# Patient Record
Sex: Female | Born: 1985 | Race: White | Hispanic: No | Marital: Married | Smoking: Never smoker
Health system: Southern US, Community
[De-identification: ages and names within clinical notes are randomized; demographics above are authoritative.]

## PROBLEM LIST (undated history)

## (undated) DIAGNOSIS — Z789 Other specified health status: Secondary | ICD-10-CM

## (undated) DIAGNOSIS — F419 Anxiety disorder, unspecified: Secondary | ICD-10-CM

## (undated) DIAGNOSIS — O09299 Supervision of pregnancy with other poor reproductive or obstetric history, unspecified trimester: Secondary | ICD-10-CM

## (undated) DIAGNOSIS — Z8759 Personal history of other complications of pregnancy, childbirth and the puerperium: Secondary | ICD-10-CM

## (undated) HISTORY — DX: Personal history of other complications of pregnancy, childbirth and the puerperium: Z87.59

## (undated) HISTORY — DX: Morbid (severe) obesity due to excess calories: E66.01

## (undated) HISTORY — DX: Supervision of pregnancy with other poor reproductive or obstetric history, unspecified trimester: O09.299

## (undated) HISTORY — PX: CYSTECTOMY: SUR359

## (undated) HISTORY — PX: WISDOM TOOTH EXTRACTION: SHX21

---

## 2019-01-14 LAB — OB RESULTS CONSOLE RUBELLA ANTIBODY, IGM: Rubella: NON-IMMUNE/NOT IMMUNE

## 2019-01-14 LAB — OB RESULTS CONSOLE HEPATITIS B SURFACE ANTIGEN: Hepatitis B Surface Ag: NEGATIVE

## 2019-01-14 LAB — OB RESULTS CONSOLE HIV ANTIBODY (ROUTINE TESTING): HIV: NONREACTIVE

## 2019-01-14 LAB — OB RESULTS CONSOLE GC/CHLAMYDIA
Chlamydia: NEGATIVE
Gonorrhea: NEGATIVE

## 2019-03-05 LAB — OB RESULTS CONSOLE RPR: RPR: NONREACTIVE

## 2019-04-30 LAB — OB RESULTS CONSOLE GBS: GBS: NEGATIVE

## 2019-05-03 NOTE — L&D Delivery Note (Signed)
Delivery Note Patient pushed for 2.5 hours after she was noted to be C/C/+1. At 3:47 PM a viable and healthy female was delivered via Vaginal, Spontaneous (Presentation: Left Occiput Anterior).  APGAR: 8, 9; weight  Pending. Shoulders and body easily delivered. Baby laid on maternal abdomen. Delayed cord clamping done. Cord cut by father.  Cord avulsion occurred and the Placenta had to be manually removed, intact, 3 vessels noted.   There was notable uterine atony and the IV was not working effectively.  A postpartum hemorrhage was called and the staff was in place.  IM methergine was given. Another large bore IV was placed in the left arm.  Vigorous uterine massage was done and then the IV pitocin was bolused in and the bleeding decreased.  The patient remained alert, awake and oriented and was hemodynamically stable. Stat CBC was drawn.  Second degree vaginal / perineal laceration was repaired with 2-0 vicryl and 2-0 chromic.  Hemostasis assured.  Patient tolerated delivery well.     Complications: Manual removal of placenta ; post partum hemorrhage.  Anesthesia: Epidural and local 1% lidocaine Episiotomy: None Lacerations: 2nd degree;Perineal Suture Repair: 2.0 vicryl and 3-0 chromic Est. Blood Loss (mL): 1594  Mom to postpartum.  Baby to Couplet care / Skin to Skin.  Gualberto Wahlen 05/08/2019, 4:50 PM

## 2019-05-07 ENCOUNTER — Inpatient Hospital Stay (HOSPITAL_COMMUNITY)
Admission: AD | Admit: 2019-05-07 | Discharge: 2019-05-10 | DRG: 806 | Disposition: A | Discharge status: Home / Self Care | Payer: 59 | Attending: Obstetrics & Gynecology | Admitting: Obstetrics & Gynecology

## 2019-05-07 ENCOUNTER — Encounter (HOSPITAL_COMMUNITY): Payer: Self-pay | Admitting: Obstetrics & Gynecology

## 2019-05-07 ENCOUNTER — Inpatient Hospital Stay (HOSPITAL_COMMUNITY): Payer: 59 | Admitting: Anesthesiology

## 2019-05-07 ENCOUNTER — Other Ambulatory Visit: Payer: Self-pay

## 2019-05-07 DIAGNOSIS — J45909 Unspecified asthma, uncomplicated: Secondary | ICD-10-CM | POA: Diagnosis present

## 2019-05-07 DIAGNOSIS — O3660X0 Maternal care for excessive fetal growth, unspecified trimester, not applicable or unspecified: Secondary | ICD-10-CM | POA: Diagnosis present

## 2019-05-07 DIAGNOSIS — O99214 Obesity complicating childbirth: Secondary | ICD-10-CM | POA: Diagnosis present

## 2019-05-07 DIAGNOSIS — O4290 Premature rupture of membranes, unspecified as to length of time between rupture and onset of labor, unspecified weeks of gestation: Secondary | ICD-10-CM | POA: Diagnosis present

## 2019-05-07 DIAGNOSIS — Z3A37 37 weeks gestation of pregnancy: Secondary | ICD-10-CM | POA: Diagnosis not present

## 2019-05-07 DIAGNOSIS — O403XX0 Polyhydramnios, third trimester, not applicable or unspecified: Secondary | ICD-10-CM | POA: Diagnosis present

## 2019-05-07 DIAGNOSIS — O139 Gestational [pregnancy-induced] hypertension without significant proteinuria, unspecified trimester: Secondary | ICD-10-CM | POA: Diagnosis not present

## 2019-05-07 DIAGNOSIS — O4292 Full-term premature rupture of membranes, unspecified as to length of time between rupture and onset of labor: Secondary | ICD-10-CM | POA: Diagnosis present

## 2019-05-07 DIAGNOSIS — Z2839 Other underimmunization status: Secondary | ICD-10-CM

## 2019-05-07 DIAGNOSIS — Z283 Underimmunization status: Secondary | ICD-10-CM

## 2019-05-07 DIAGNOSIS — O9952 Diseases of the respiratory system complicating childbirth: Secondary | ICD-10-CM | POA: Diagnosis present

## 2019-05-07 DIAGNOSIS — O3663X0 Maternal care for excessive fetal growth, third trimester, not applicable or unspecified: Secondary | ICD-10-CM | POA: Diagnosis present

## 2019-05-07 DIAGNOSIS — O42913 Preterm premature rupture of membranes, unspecified as to length of time between rupture and onset of labor, third trimester: Secondary | ICD-10-CM | POA: Diagnosis not present

## 2019-05-07 HISTORY — DX: Other specified health status: Z78.9

## 2019-05-07 LAB — COMPREHENSIVE METABOLIC PANEL
ALT: 16 U/L (ref 0–44)
AST: 18 U/L (ref 15–41)
Albumin: 2.5 g/dL — ABNORMAL LOW (ref 3.5–5.0)
Alkaline Phosphatase: 123 U/L (ref 38–126)
Anion gap: 12 (ref 5–15)
BUN: 12 mg/dL (ref 6–20)
CO2: 17 mmol/L — ABNORMAL LOW (ref 22–32)
Calcium: 9.8 mg/dL (ref 8.9–10.3)
Chloride: 106 mmol/L (ref 98–111)
Creatinine, Ser: 0.62 mg/dL (ref 0.44–1.00)
GFR calc Af Amer: >60 mL/min (ref >60)
GFR calc non Af Amer: >60 mL/min (ref >60)
Glucose, Bld: 93 mg/dL (ref 70–99)
Potassium: 3.8 mmol/L (ref 3.5–5.1)
Sodium: 135 mmol/L (ref 135–145)
Total Bilirubin: 0.6 mg/dL (ref 0.3–1.2)
Total Protein: 6 g/dL — ABNORMAL LOW (ref 6.5–8.1)

## 2019-05-07 LAB — ABO/RH: ABO/RH(D): O POS

## 2019-05-07 LAB — CBC
HCT: 37.7 % (ref 36.0–46.0)
Hemoglobin: 12.6 g/dL (ref 12.0–15.0)
MCH: 28.6 pg (ref 26.0–34.0)
MCHC: 33.4 g/dL (ref 30.0–36.0)
MCV: 85.5 fL (ref 80.0–100.0)
Platelets: 296 10*3/uL (ref 150–400)
RBC: 4.41 MIL/uL (ref 3.87–5.11)
RDW: 13.6 % (ref 11.5–15.5)
WBC: 9.1 10*3/uL (ref 4.0–10.5)
nRBC: 0 % (ref 0.0–0.2)

## 2019-05-07 LAB — PROTEIN / CREATININE RATIO, URINE
Creatinine, Urine: 28.02 mg/dL
Total Protein, Urine: <6 mg/dL

## 2019-05-07 LAB — LACTATE DEHYDROGENASE: LDH: 133 U/L (ref 98–192)

## 2019-05-07 LAB — POCT FERN TEST: POCT Fern Test: POSITIVE

## 2019-05-07 LAB — TYPE AND SCREEN
ABO/RH(D): O POS
Antibody Screen: NEGATIVE

## 2019-05-07 LAB — URIC ACID: Uric Acid, Serum: 6.3 mg/dL (ref 2.5–7.1)

## 2019-05-07 MED ORDER — EPHEDRINE 5 MG/ML INJ: 10.0000 mg | INTRAVENOUS | Status: DC | PRN

## 2019-05-07 MED ORDER — TERBUTALINE SULFATE 1 MG/ML IJ SOLN: 0.2500 mg | Freq: Once | INTRAMUSCULAR | Status: DC | PRN

## 2019-05-07 MED ORDER — DIPHENHYDRAMINE HCL 50 MG/ML IJ SOLN: 12.5000 mg | INTRAMUSCULAR | Status: DC | PRN

## 2019-05-07 MED ORDER — OXYTOCIN 40 UNITS IN NORMAL SALINE INFUSION - SIMPLE MED
1.0000 m[IU]/min | INTRAVENOUS | Status: DC
  Administered 2019-05-07: 14:00:00 1 m[IU]/min via INTRAVENOUS
  Filled 2019-05-07: qty 1000

## 2019-05-07 MED ORDER — SODIUM CHLORIDE (PF) 0.9 % IJ SOLN
INTRAMUSCULAR | Status: DC | PRN
  Administered 2019-05-07: 12 mL/h via EPIDURAL

## 2019-05-07 MED ORDER — FENTANYL-BUPIVACAINE-NACL 0.5-0.125-0.9 MG/250ML-% EP SOLN
12.0000 mL/h | EPIDURAL | Status: DC | PRN
  Administered 2019-05-08: 12 mL/h via EPIDURAL
  Filled 2019-05-07 (×2): qty 250

## 2019-05-07 MED ORDER — MISOPROSTOL 50MCG HALF TABLET
50.0000 ug | ORAL_TABLET | Freq: Once | ORAL | Status: AC
  Administered 2019-05-07: 05:00:00 50 ug via ORAL
  Filled 2019-05-07: qty 1

## 2019-05-07 MED ORDER — PHENYLEPHRINE 40 MCG/ML (10ML) SYRINGE FOR IV PUSH (FOR BLOOD PRESSURE SUPPORT): 80.0000 ug | PREFILLED_SYRINGE | INTRAVENOUS | Status: DC | PRN

## 2019-05-07 MED ORDER — OXYTOCIN 40 UNITS IN NORMAL SALINE INFUSION - SIMPLE MED
2.5000 [IU]/h | INTRAVENOUS | Status: DC
  Filled 2019-05-07: qty 1000

## 2019-05-07 MED ORDER — LACTATED RINGERS IV SOLN: 500.0000 mL | INTRAVENOUS | Status: DC | PRN

## 2019-05-07 MED ORDER — OXYTOCIN BOLUS FROM INFUSION
500.0000 mL | Freq: Once | INTRAVENOUS | Status: AC
  Administered 2019-05-08: 16:00:00 500 mL via INTRAVENOUS

## 2019-05-07 MED ORDER — OXYCODONE-ACETAMINOPHEN 5-325 MG PO TABS: 2.0000 | ORAL_TABLET | ORAL | Status: DC | PRN

## 2019-05-07 MED ORDER — LIDOCAINE HCL (PF) 1 % IJ SOLN
30.0000 mL | INTRAMUSCULAR | Status: AC | PRN
  Administered 2019-05-08: 30 mL via SUBCUTANEOUS
  Filled 2019-05-07: qty 30

## 2019-05-07 MED ORDER — OXYCODONE-ACETAMINOPHEN 5-325 MG PO TABS: 1.0000 | ORAL_TABLET | ORAL | Status: DC | PRN

## 2019-05-07 MED ORDER — SODIUM CHLORIDE 0.9 % IV SOLN
3.0000 g | Freq: Four times a day (QID) | INTRAVENOUS | Status: DC
  Administered 2019-05-08 – 2019-05-10 (×9): 3 g via INTRAVENOUS
  Filled 2019-05-07: qty 8
  Filled 2019-05-07 (×3): qty 3
  Filled 2019-05-07: qty 8
  Filled 2019-05-07 (×3): qty 3
  Filled 2019-05-07 (×7): qty 8

## 2019-05-07 MED ORDER — LACTATED RINGERS IV SOLN: INTRAVENOUS | Status: DC

## 2019-05-07 MED ORDER — LACTATED RINGERS IV SOLN: 500.0000 mL | Freq: Once | INTRAVENOUS | Status: DC

## 2019-05-07 MED ORDER — ONDANSETRON HCL 4 MG/2ML IJ SOLN
4.0000 mg | Freq: Four times a day (QID) | INTRAMUSCULAR | Status: DC | PRN
  Administered 2019-05-08 (×2): 4 mg via INTRAVENOUS
  Filled 2019-05-07 (×2): qty 2

## 2019-05-07 MED ORDER — ACETAMINOPHEN 325 MG PO TABS: 650.0000 mg | ORAL_TABLET | ORAL | Status: DC | PRN

## 2019-05-07 MED ORDER — PHENYLEPHRINE 40 MCG/ML (10ML) SYRINGE FOR IV PUSH (FOR BLOOD PRESSURE SUPPORT)
80.0000 ug | PREFILLED_SYRINGE | INTRAVENOUS | Status: DC | PRN
  Filled 2019-05-07: qty 10

## 2019-05-07 MED ORDER — LIDOCAINE HCL (PF) 1 % IJ SOLN
INTRAMUSCULAR | Status: DC | PRN
  Administered 2019-05-07: 10 mL via EPIDURAL

## 2019-05-07 MED ORDER — SOD CITRATE-CITRIC ACID 500-334 MG/5ML PO SOLN
30.0000 mL | ORAL | Status: DC | PRN
  Administered 2019-05-08 (×2): 30 mL via ORAL
  Filled 2019-05-07 (×2): qty 30

## 2019-05-07 MED ORDER — MISOPROSTOL 50MCG HALF TABLET
50.0000 ug | ORAL_TABLET | Freq: Once | ORAL | Status: AC
  Administered 2019-05-07: 50 ug via ORAL
  Filled 2019-05-07: qty 1

## 2019-05-07 NOTE — MAU Note (Signed)
Patient reports to MAU c/o possible SROM "just before 0115 clear fluid." +FM. No bleeding. Slight abdominal cramping.

## 2019-05-07 NOTE — Progress Notes (Signed)
Subjective:    Comfortable w/ epidural.   Objective:    VS: BP 140/88   Pulse 80   Temp 97.9 F (36.6 C) (Oral)   Resp 14   Ht 5' 7.5" (1.715 m)   Wt 117.9 kg   LMP 08/16/2018   BMI 40.12 kg/m  FHR : baseline 145 / variability moerate / accelerations present / absent decelerations IUPC placed Membranes: AROM of forbag, copious, clear  Dilation: 4 Effacement (%): 80 Cervical Position: Posterior Station: -2 Presentation: Vertex Exam by:: Lizbeth Bark, CNM  Pitocin 74mU/min  Assessment/Plan:   34 y.o. G1P0 [redacted]w[redacted]d  Labor: on Pitocin, IUPC inserted Preeclampsia:  no signs or symptoms of toxicity Fetal Wellbeing:  Category I Pain Control:  Epidural I/D:  GBS negative Anticipated MOD:  Hopeful for NSVD  Rita Stokes CNM, MSN 05/07/2019 5:23 PM

## 2019-05-07 NOTE — MAU Provider Note (Signed)
First Provider Initiated Contact with Patient 05/07/19 [phone]       S: Ms. Rita Stokes is a 34 y.o. G1P0 at [redacted]w[redacted]d  who presents to MAU today complaining of leaking of fluid since 0115 this morning. She denies vaginal bleeding. She denies contractions. She reports normal fetal movement.    O: BP 130/83   Pulse 100   Resp 18   LMP 08/16/2018  GENERAL: Well-developed, well-nourished female in no acute distress.  HEAD: Normocephalic, atraumatic.  CHEST: Normal effort of breathing, regular heart rate ABDOMEN: Soft, nontender, gravid PELVIC: Normal external female genitalia. Vagina is pink and rugated. Cervix with normal contour, no lesions. Normal discharge.  Positive pooling.   Cervical exam: deferred     Fetal Monitoring: Baseline: 140 Variability: moderate Accelerations: 15x15 Decelerations: none Contractions: UI  Results for orders placed or performed during the hospital encounter of 05/07/19 (from the past 24 hour(s))  Fern Test     Status: Abnormal   Collection Time: 05/07/19  3:36 AM  Result Value Ref Range   POCT Fern Test Positive = ruptured amniotic membanes      A: [facility] at [redacted]w[redacted]d  SROM  P: RN to call [facility] provider  Judeth Horn, NP 05/07/2019 3:37 AM

## 2019-05-07 NOTE — H&P (Addendum)
Rita Stokes is a 34 y.o. female, G1P0000, IUP at 37.5 weeks, presenting for PROM at 0130 on 05/07/2019, clear, noe feeling occ cxt. Recently dx with polyhydramnios with AFI 31. Last US showed umbilical vein varix stable, growth 9.8lbs 98%, BPP 8/8. PNC consist of:  Asthma (childhood, exercise induced), large for gestation age fetus (98%), rubella non-immune (Offer vaccine PP), umbilical cord finding (Umbilical cord varix 1.1 cm noted at 28 weeks, and on 12/30 1.0). GBS Negative. Baby Female. Pt endorse + Fm. Denies vaginal bleeding.   Patient Active Problem List   Diagnosis Date Noted  . PROM (premature rupture of membranes) 05/07/2019  . LGA (large for gestational age) fetus affecting management of mother 05/07/2019  . Rubella non-immune status, antepartum 05/07/2019  . Childhood asthma 05/07/2019   Medications Prior to Admission  Medication Sig Dispense Refill Last Dose  . calcium carbonate (TUMS - DOSED IN MG ELEMENTAL CALCIUM) 500 MG chewable tablet Chew 1 tablet by mouth daily.   05/06/2019 at Unknown time  . famotidine (PEPCID) 10 MG tablet Take 10 mg by mouth 2 (two) times daily.   Past Week at Unknown time  . Prenatal Vit-Fe Fumarate-FA (MULTIVITAMIN-PRENATAL) 27-0.8 MG TABS tablet Take 1 tablet by mouth daily at 12 noon.   05/06/2019 at Unknown time  . Pyridoxine HCl (VITAMIN B-6 PO) Take by mouth.   05/06/2019 at Unknown time  . VITAMIN D PO Take by mouth.   05/06/2019 at Unknown time    Past Medical History:  Diagnosis Date  . Medical history non-contributory      No current facility-administered medications on file prior to encounter.   Current Outpatient Medications on File Prior to Encounter  Medication Sig Dispense Refill  . calcium carbonate (TUMS - DOSED IN MG ELEMENTAL CALCIUM) 500 MG chewable tablet Chew 1 tablet by mouth daily.    . famotidine (PEPCID) 10 MG tablet Take 10 mg by mouth 2 (two) times daily.    . Prenatal Vit-Fe Fumarate-FA (MULTIVITAMIN-PRENATAL) 27-0.8 MG  TABS tablet Take 1 tablet by mouth daily at 12 noon.    . Pyridoxine HCl (VITAMIN B-6 PO) Take by mouth.    Marland Kitchen VITAMIN D PO Take by mouth.       No Known Allergies  History of present pregnancy: Pt Info/Preference:  Screening/Consents:  Labs:   EDD: Estimated Date of Delivery: 05/23/19  Establised: Patient's last menstrual period was 08/16/2018.  Anatomy Scan: Date: 02/07/2019 Placenta Location: anterior Genetic Screen: Panoroma: Deceline AFP: WNL First Tri: Quad:  Office: [facility]             First PNV: 21.4 wg Blood Type --/--/O POS, O POS Performed at [facility], [address], [place], [place] [address]  [phone]/05 0345)O+  Language: English Last PNV: 37 wg Rhogam  N/A  Flu Vaccine:  UTD   Antibody NEG (01/05 0345)Neg  TDaP vaccine UTD   GTT: Early: N/A Third Trimester: 123  Feeding Plan: Breast BTL: no Rubella: Nonimmune (09/14 0000)Non-Immune  Contraception: ??? VBAC: No RPR: Nonreactive (11/03 0000) NR  Circumcision: N/A   HBsAg: Negative (09/14 0000)NEg  Pediatrician:  ???   HIV: Non-reactive (09/14 0000) NR  Prenatal Classes: no Additional [place]: 05/02/2019 see belwo GBS: Negative/-- (12/29 0000)Negative(For PCN allergy, check sensitivities)       Chlamydia: neg    MFM Referral/Consult:  GC: Neg  Support Person: Husband   PAP: ???  Pain Management: epidural Neonatologist Referral:  Hgb Electrophoresis:  N/A  Birth Plan: none  Hgb NOB: 13.8    28W: 12  05/02/2019 growth:  OB History    Gravida  1   Para      Term      Preterm      AB      Living        SAB      TAB      Ectopic      Multiple      Live Births             Past Medical History:  Diagnosis Date  . Medical history non-contributory    Past Surgical History:  Procedure Laterality Date  . CYSTECTOMY     Family History: family history is not on file. Social History:  reports that she has never smoked. She has never used smokeless tobacco. She reports that she does not drink alcohol  or use drugs.   Prenatal Transfer Tool  Maternal Diabetes: No Genetic Screening: Declined Normal AFP Maternal Ultrasounds/Referrals: Other: Umbilical vein varix measure 1.0 Fetal Ultrasounds or other Referrals:  Referred to Materal Fetal Medicine  Maternal Substance Abuse:  No Significant Maternal Medications:  None Significant Maternal Lab Results: Group B Strep negative  ROS:  Review of Systems  Constitutional: Negative.   HENT: Negative.   Eyes: Negative.   Respiratory: Negative.   Cardiovascular: Negative.   Gastrointestinal: Positive for abdominal pain.       Occ cxt  Genitourinary: Negative.        Vaginal leakage   Musculoskeletal: Negative.   Skin: Negative.   Neurological: Negative.   Endo/Heme/Allergies: Negative.   Psychiatric/Behavioral: The patient does not have insomnia ([place] .now).      Physical Exam: BP 118/63   Pulse 80   Temp 98 F (36.7 C) (Oral)   Resp 17   Ht 5' 7.5" (1.715 m)   Wt 117.9 kg   LMP 08/16/2018   BMI 40.12 kg/m   Physical Exam  Constitutional: She is oriented to person, place, and time and well-developed, well-nourished, and in no distress.  HENT:  Head: Normocephalic and atraumatic.  Eyes: Pupils are equal, round, and reactive to light. Conjunctivae are normal.  Cardiovascular: Normal rate and regular rhythm.  Pulmonary/Chest: Effort normal and breath sounds normal.  Abdominal: Soft. Bowel sounds are normal.  Genitourinary:    Genitourinary Comments: uterus gravida, equal to dates, pelvis adequate for vaginal delivery. Soft-non-tender    Musculoskeletal:        General: Normal range of motion.     Cervical back: Normal range of motion and neck supple.  Neurological: She is alert and oriented to person, place, and time.  Skin: Skin is warm and dry.  Psychiatric: Affect normal.  Nursing note and vitals reviewed.    NST: FHR baseline 140 bpm, Variability: moderate, Accelerations:present, Decelerations:  Absent= Cat  1/Reactive UC:   Occ SVE:   Dilation: 1 Effacement (%): Thick Station: -3 Exam by:: K.Cowher RN  Leopold's: , EFW 9.5lbs via leopold's.  Position Bedside US vertex   Labs: Results for orders placed or performed during the hospital encounter of 05/07/19 (from the past 24 hour(s))  Fern Test     Status: Abnormal   Collection Time: 05/07/19  3:36 AM  Result Value Ref Range   POCT Fern Test Positive = ruptured amniotic membanes   Type and screen [place]     Status: None   Collection Time: 05/07/19  3:45 AM  Result Value Ref Range  ABO/RH(D) O POS    Antibody Screen NEG    Sample Expiration      05/10/2019,2359 Performed at [facility], [address], [place], [place] [address]   ABO/Rh     Status: None (Preliminary result)   Collection Time: 05/07/19  3:45 AM  Result Value Ref Range   ABO/RH(D)      O POS Performed at [facility], [address], [place], [place] [address]     Imaging:  No results found.  MAU Course: Orders Placed This Encounter  Procedures  . OB RESULT CONSOLE Group B Strep  . CBC  . RPR  . OB RESULTS CONSOLE GC/Chlamydia  . OB RESULTS CONSOLE RPR  . OB RESULTS CONSOLE HIV antibody  . OB RESULTS CONSOLE Rubella Antibody  . OB RESULTS CONSOLE Hepatitis B surface antigen  . Comprehensive metabolic panel  . Uric acid  . Lactate dehydrogenase  . Protein / creatinine ratio, urine  . Diet clear liquid Room service appropriate? Yes; Fluid consistency: Thin  . Vitals signs per unit policy  . Notify Physician  . Fetal monitoring per unit policy  . Activity as tolerated  . Cervical Exam  . Measure blood pressure post delivery every 15 min x 1 hour then every 30 min x 1 hour  . Fundal check post delivery every 15 min x 1 hour then every 30 min x 1 hour  . If Rapid HIV test positive or known HIV positive: initiate AZT orders  . May in and out cath x 2 for inability to void  . Insert foley catheter  . Discontinue  foley prior to vaginal delivery  . Initiate Carrier Fluid Protocol  . Initiate Oral Care Protocol  . Order Rapid HIV per protocol if no results on chart  . Practitioner attestation of consent  . Patient may have epidural placement upon request  . Evaluate fetal heart rate to establish reassuring pattern prior to initiating Cytotec or Pitocin  . Perform a cervical exam prior to initiating Cytotec or Pitocin  . Discontinue Pitocin if tachysystole with non-reassuring FHR is present  . Nofify MD/CNM if tachysystole with non-reassuring FHR is present  . Initiate intrauterine resuscitation if tachysystole with non-reasuring FHR is present  . If tachysystole WITH reassuring FHR present notify MD / CNM  . May administer Terbutaline 0.25 mg SQ x 1 dose if tachysystole with non-reassuring FHR is presesnt  . Labor Augmentation  . Full code  . Fern Test  . Type and screen [facility]  . ABO/Rh  . Insert and maintain IV Line  . Admit to Inpatient (patient's expected length of stay will be greater than 2 midnights or inpatient only procedure)   Meds ordered this encounter  Medications  . lactated ringers infusion  . oxytocin (PITOCIN) IV BOLUS FROM BAG  . oxytocin (PITOCIN) IV infusion 40 units in NS 1000 mL - Premix  . lactated ringers infusion 500-1,000 mL  . acetaminophen (TYLENOL) tablet 650 mg  . oxyCODONE-acetaminophen (PERCOCET/ROXICET) 5-325 MG per tablet 1 tablet  . oxyCODONE-acetaminophen (PERCOCET/ROXICET) 5-325 MG per tablet 2 tablet  . ondansetron (ZOFRAN) injection 4 mg  . sodium citrate-citric acid (ORACIT) solution 30 mL  . lidocaine (PF) (XYLOCAINE) 1 % injection 30 mL  . terbutaline (BRETHINE) injection 0.25 mg  . misoprostol (CYTOTEC) tablet 50 mcg    Assessment/Plan: Arcola Freshour is a 34 y.o. female, G1P0000, IUP at 37.5 weeks, presenting for PROM at 0130 on 05/07/2019,  clear, noe feeling occ cxt. Recently dx with polyhydramnios with AFI 31. Last US  showed umbilical vein varix stable, growth 9.8lbs 98%, BPP 8/8. PNC consist of:  Asthma (childhood, exercise induced), large for gestation age fetus (98%), rubella non-immune (Offer vaccine PP), umbilical cord finding (Umbilical cord varix 1.1 cm noted at 28 weeks, and on 12/30 1.0). GBS Negative. Baby Female. Pt endorse + Fm. Denies vaginal bleeding.  FWB: Cat 1 Fetal Tracing.  I discussed with patient risks, benefits and alternatives of labor induction including higher risk of cesarean delivery compared to spontaneous labor.  We discussed risks of induction agents including effects on fetal heart beat, contraction pattern and need for close monitoring.  Patient expressed understanding of all this and desired to proceed with the induction. Risks and benefits of induction were reviewed, including failure of method, prolonged labor, need for further intervention, risk of cesarean.  Patient and family verbalized understanding and denies any further questions at this time. Pt and family wish to proceed with induction process. Discussed induction options of Cytotec, foley bulb, AROM, and pitocin were reviewed as well as risks and benefits with use of each discussed. Pt and husband verbalized plan to star with Cytotec.     Plan: Admit to [facility]  Routine [facility] orders Pain med/epidural prn Plan to start with x1 dose of Cytotec PO 50 mcg for cervical ripening. Rubella Non-immune: Plan for vaccine PP Anticipate labor progression   [facility] NP-C, CNM, MSN 05/07/2019, 7:17 AM  Addendum 7:17 AM Pt having elevated Bps, range 140/90-80s, does not yet meet criteria for [facility], denies s/sx, placed PreE labs. Pending, Dr Sallye Ober notified via text.

## 2019-05-07 NOTE — Anesthesia Procedure Notes (Signed)
Epidural Patient location during procedure: OB Start time: 05/07/2019 3:34 PM End time: 05/07/2019 3:42 PM  Staffing Anesthesiologist: Lucretia Kern, MD Performed: anesthesiologist   Preanesthetic Checklist Completed: patient identified, IV checked, risks and benefits discussed, monitors and equipment checked, pre-op evaluation and timeout performed  Epidural Patient position: sitting Prep: DuraPrep Patient monitoring: heart rate, continuous pulse ox and blood pressure Approach: midline Location: L3-L4 Injection technique: LOR air  Needle:  Needle type: Tuohy  Needle gauge: 17 G Needle length: 9 cm Needle insertion depth: 6 cm Catheter type: closed end flexible Catheter size: 19 Gauge Catheter at skin depth: 11 cm Test dose: negative  Assessment Events: blood not aspirated, injection not painful, no injection resistance, no paresthesia and negative IV test  Additional Notes Reason for block:procedure for pain

## 2019-05-07 NOTE — Anesthesia Preprocedure Evaluation (Signed)
Anesthesia Evaluation  Patient identified by MRN, date of birth, ID band Patient awake    Reviewed: Allergy & Precautions, H&P , NPO status , Patient's Chart, lab work & pertinent test results  History of Anesthesia Complications Negative for: history of anesthetic complications  Airway Mallampati: II  TM Distance: >3 FB Neck ROM: full    Dental no notable dental hx.    Pulmonary neg pulmonary ROS,    Pulmonary exam normal        Cardiovascular negative cardio ROS Normal cardiovascular exam Rhythm:regular Rate:Normal     Neuro/Psych negative neurological ROS  negative psych ROS   GI/Hepatic negative GI ROS, Neg liver ROS,   Endo/Other  Morbid obesity  Renal/GU negative Renal ROS  negative genitourinary   Musculoskeletal   Abdominal   Peds  Hematology negative hematology ROS (+)   Anesthesia Other Findings   Reproductive/Obstetrics (+) Pregnancy                             Anesthesia Physical Anesthesia Plan  ASA: III  Anesthesia Plan: Epidural   Post-op Pain Management:    Induction:   PONV Risk Score and Plan:   Airway Management Planned:   Additional Equipment:   Intra-op Plan:   Post-operative Plan:   Informed Consent: I have reviewed the patients History and Physical, chart, labs and discussed the procedure including the risks, benefits and alternatives for the proposed anesthesia with the patient or authorized representative who has indicated his/her understanding and acceptance.       Plan Discussed with:   Anesthesia Plan Comments:         Anesthesia Quick Evaluation  

## 2019-05-07 NOTE — Progress Notes (Signed)
Subjective:    Able to rest w/ ctx. Plans epidural for active labor.   Objective:    VS: BP (!) 141/88   Pulse 79   Temp 97.9 F (36.6 C) (Oral)   Resp 14   Ht 5' 7.5" (1.715 m)   Wt 117.9 kg   LMP 08/16/2018   BMI 40.12 kg/m  FHR : baseline 135 / variability moderate / accelerations present / absent decelerations Toco: contractions every 2-4.5 minutes  Membranes: Intact Dilation: 2.5 Effacement (%): 80 Cervical Position: Posterior Station: Ballotable Presentation: Vertex Exam by:: Lizbeth Bark, CNM Start Pitocin  Assessment/Plan:   34 y.o. G1P0 [redacted]w[redacted]d  Labor: S/P Oral Cytotec x2 doses, starting Pitocin now Preeclampsia:  no signs or symptoms of toxicity Fetal Wellbeing:  Category I Pain Control:  Labor support without medications, plans epidural for active labor I/D:  GBS negative Anticipated MOD:  NSVD  Roma Schanz CNM, MSN 05/07/2019 2:17 PM

## 2019-05-07 NOTE — Progress Notes (Signed)
Subjective:    Comfortable w/ epidural. Discussed option for primary C-section for suspected macrosomia. U/S on 05/01/19 EFW 9.5#. Pt and spouse request to continue labor trial "as long as mom and baby are doing well". Dr. Richardson Dopp made aware of pt's request.   Objective:    VS: BP 135/67   Pulse 100   Temp 98.7 F (37.1 C) (Oral)   Resp 15   Ht 5' 7.5" (1.715 m)   Wt 117.9 kg   LMP 08/16/2018   BMI 40.12 kg/m  FHR : baseline 140 / variability moderate / accelerations present / absent decelerations IUPC: MVU 160-180 from 1800-2030, Pitocin increased   Membranes: SROM since 0100, clear Dilation: 4 Effacement (%): 80 Cervical Position: Posterior Station: -2 Presentation: Vertex Exam by:: [name], CNM  Assessment/Plan:   34 y.o. G1P0 [redacted]w[redacted]d  Labor: No cervical change, continue to increase Pitocin to maintain MVU @ 200 Fetal Wellbeing:  Category I Pain Control:  Epidural I/D:  GBS negative Anticipated MOD:  hopeful for NSVD, discussed possible PCS for macrosomia  Rita Stokes CNM, MSN 05/07/2019 9:16 PM

## 2019-05-08 ENCOUNTER — Encounter (HOSPITAL_COMMUNITY): Payer: Self-pay | Admitting: Obstetrics & Gynecology

## 2019-05-08 LAB — CBC
HCT: 34.4 % — ABNORMAL LOW (ref 36.0–46.0)
Hemoglobin: 11.5 g/dL — ABNORMAL LOW (ref 12.0–15.0)
MCH: 28.9 pg (ref 26.0–34.0)
MCHC: 33.4 g/dL (ref 30.0–36.0)
MCV: 86.4 fL (ref 80.0–100.0)
Platelets: 276 10*3/uL (ref 150–400)
RBC: 3.98 MIL/uL (ref 3.87–5.11)
RDW: 14.1 % (ref 11.5–15.5)
WBC: 26.5 10*3/uL — ABNORMAL HIGH (ref 4.0–10.5)
nRBC: 0 % (ref 0.0–0.2)

## 2019-05-08 MED ORDER — ONDANSETRON HCL 4 MG PO TABS: 4.0000 mg | ORAL_TABLET | ORAL | Status: DC | PRN

## 2019-05-08 MED ORDER — OXYTOCIN 40 UNITS IN NORMAL SALINE INFUSION - SIMPLE MED: 2.5000 [IU]/h | INTRAVENOUS | Status: DC | PRN

## 2019-05-08 MED ORDER — METHYLERGONOVINE MALEATE 0.2 MG/ML IJ SOLN
INTRAMUSCULAR | Status: AC
  Administered 2019-05-08: 16:00:00 0.2 mg
  Filled 2019-05-08: qty 1

## 2019-05-08 MED ORDER — BENZOCAINE-MENTHOL 20-0.5 % EX AERO
1.0000 "application " | INHALATION_SPRAY | CUTANEOUS | Status: DC | PRN
  Administered 2019-05-08: 1 via TOPICAL
  Filled 2019-05-08: qty 56

## 2019-05-08 MED ORDER — PRENATAL MULTIVITAMIN CH
1.0000 | ORAL_TABLET | Freq: Every day | ORAL | Status: DC
  Administered 2019-05-09 – 2019-05-10 (×2): 1 via ORAL
  Filled 2019-05-08 (×2): qty 1

## 2019-05-08 MED ORDER — CALCIUM CARBONATE ANTACID 500 MG PO CHEW
1.0000 | CHEWABLE_TABLET | Freq: Every day | ORAL | Status: DC
  Administered 2019-05-09 – 2019-05-10 (×2): 200 mg via ORAL
  Filled 2019-05-08 (×2): qty 1

## 2019-05-08 MED ORDER — OXYCODONE-ACETAMINOPHEN 5-325 MG PO TABS: 2.0000 | ORAL_TABLET | ORAL | Status: DC | PRN

## 2019-05-08 MED ORDER — ACETAMINOPHEN 325 MG PO TABS: 650.0000 mg | ORAL_TABLET | ORAL | Status: DC | PRN

## 2019-05-08 MED ORDER — METHYLERGONOVINE MALEATE 0.2 MG/ML IJ SOLN: 0.2000 mg | INTRAMUSCULAR | Status: DC | PRN

## 2019-05-08 MED ORDER — METHYLERGONOVINE MALEATE 0.2 MG PO TABS: 0.2000 mg | ORAL_TABLET | ORAL | Status: DC | PRN

## 2019-05-08 MED ORDER — COCONUT OIL OIL
1.0000 "application " | TOPICAL_OIL | Status: DC | PRN
  Administered 2019-05-09: 1 via TOPICAL

## 2019-05-08 MED ORDER — PANTOPRAZOLE SODIUM 40 MG IV SOLR
40.0000 mg | Freq: Two times a day (BID) | INTRAVENOUS | Status: DC
  Administered 2019-05-08 – 2019-05-09 (×3): 40 mg via INTRAVENOUS
  Filled 2019-05-08 (×5): qty 40

## 2019-05-08 MED ORDER — WITCH HAZEL-GLYCERIN EX PADS: 1.0000 "application " | MEDICATED_PAD | CUTANEOUS | Status: DC | PRN

## 2019-05-08 MED ORDER — TETANUS-DIPHTH-ACELL PERTUSSIS 5-2.5-18.5 LF-MCG/0.5 IM SUSP: 0.5000 mL | Freq: Once | INTRAMUSCULAR | Status: DC

## 2019-05-08 MED ORDER — SIMETHICONE 80 MG PO CHEW: 80.0000 mg | CHEWABLE_TABLET | ORAL | Status: DC | PRN

## 2019-05-08 MED ORDER — SENNOSIDES-DOCUSATE SODIUM 8.6-50 MG PO TABS
2.0000 | ORAL_TABLET | ORAL | Status: DC
  Administered 2019-05-08 – 2019-05-09 (×2): 2 via ORAL
  Filled 2019-05-08 (×2): qty 2

## 2019-05-08 MED ORDER — FAMOTIDINE 20 MG PO TABS
10.0000 mg | ORAL_TABLET | Freq: Two times a day (BID) | ORAL | Status: DC
  Administered 2019-05-08 – 2019-05-10 (×4): 10 mg via ORAL
  Filled 2019-05-08 (×4): qty 1

## 2019-05-08 MED ORDER — OXYCODONE-ACETAMINOPHEN 5-325 MG PO TABS: 1.0000 | ORAL_TABLET | ORAL | Status: DC | PRN

## 2019-05-08 MED ORDER — IBUPROFEN 600 MG PO TABS
600.0000 mg | ORAL_TABLET | Freq: Four times a day (QID) | ORAL | Status: DC
  Administered 2019-05-08 – 2019-05-10 (×7): 600 mg via ORAL
  Filled 2019-05-08 (×7): qty 1

## 2019-05-08 MED ORDER — ONDANSETRON HCL 4 MG/2ML IJ SOLN: 4.0000 mg | INTRAMUSCULAR | Status: DC | PRN

## 2019-05-08 MED ORDER — DIPHENHYDRAMINE HCL 25 MG PO CAPS: 25.0000 mg | ORAL_CAPSULE | Freq: Four times a day (QID) | ORAL | Status: DC | PRN

## 2019-05-08 MED ORDER — DIBUCAINE (PERIANAL) 1 % EX OINT: 1.0000 "application " | TOPICAL_OINTMENT | CUTANEOUS | Status: DC | PRN

## 2019-05-08 MED ORDER — ZOLPIDEM TARTRATE 5 MG PO TABS: 5.0000 mg | ORAL_TABLET | Freq: Every evening | ORAL | Status: DC | PRN

## 2019-05-08 MED ORDER — LACTATED RINGERS IV SOLN: INTRAVENOUS | Status: DC

## 2019-05-08 NOTE — Progress Notes (Signed)
Rita Stokes is a 34 y.o. G1P0 at [redacted]w[redacted]d by LMP admitted for induction of labor due to Spontaneous rupture of BOW.  Subjective: Patient feeling pressure   Objective: BP 94/66   Pulse (!) 127   Temp 99 F (37.2 C) (Oral)   Resp 18   Ht 5' 7.5" (1.715 m)   Wt 117.9 kg   LMP 08/16/2018   BMI 40.12 kg/m  I/O last 3 completed shifts: In: -  Out: 650 [Urine:650] No intake/output data recorded.  FHT:  FHR: 150 bpm, variability: moderate,  accelerations:  Present,  decelerations:  Present early variable decels with contractions UC:   regular, every 3 minutes SVE:   Dilation: 10 Effacement (%): 100 Station: 0 Exam by:: Devra Dopp, RN   Labs: Lab Results  Component Value Date   WBC 9.1 05/07/2019   HGB 12.6 05/07/2019   HCT 37.7 05/07/2019   MCV 85.5 05/07/2019   PLT 296 05/07/2019    Assessment / Plan: Protracted active phase, now in second stage of labor  Labor: Progressing on Pitocin, now in second stage of labor Preeclampsia:  no signs or symptoms of toxicity Fetal Wellbeing:  Category II Pain Control:  Epidural I/D:  n/a Anticipated MOD:  NSVD  Michael Ventresca 05/08/2019, 1:31 PM

## 2019-05-08 NOTE — Progress Notes (Signed)
Subjective:    Comfortable w/ epidural and well-rested.   Objective:    VS: BP (!) 141/94   Pulse (!) 116   Temp 98.9 F (37.2 C) (Axillary)   Resp 15   Ht 5' 7.5" (1.715 m)   Wt 117.9 kg   LMP 08/16/2018   BMI 40.12 kg/m  FHR : baseline 135 / variability moderate / accelerations present / absnet decelerations IUPC: MVU 190-220 Membranes: AROM x29 hrs  Dilation: 6 Effacement (%): 100 Cervical Position: Posterior Station: 0 Presentation: Vertex Exam by:: [name], CNM  Assessment/Plan:   34 y.o. G1P0 [redacted]w[redacted]d  Labor: active labor, adequate ctx Fetal Wellbeing:  Category I Pain Control:  Epidural I/D:  Started Unasyn 3G for prolonged ROM @ 0100. Dose #2 infusing now Anticipated MOD:  suspect primary C/S for macrosomia if no change now that pt is in active labor  Rita Stokes CNM, MSN 05/08/2019 6:46 AM

## 2019-05-09 LAB — CBC
HCT: 30.5 % — ABNORMAL LOW (ref 36.0–46.0)
Hemoglobin: 10.2 g/dL — ABNORMAL LOW (ref 12.0–15.0)
MCH: 28.7 pg (ref 26.0–34.0)
MCHC: 33.4 g/dL (ref 30.0–36.0)
MCV: 85.9 fL (ref 80.0–100.0)
Platelets: 259 10*3/uL (ref 150–400)
RBC: 3.55 MIL/uL — ABNORMAL LOW (ref 3.87–5.11)
RDW: 14.1 % (ref 11.5–15.5)
WBC: 22.3 10*3/uL — ABNORMAL HIGH (ref 4.0–10.5)
nRBC: 0 % (ref 0.0–0.2)

## 2019-05-09 NOTE — Progress Notes (Signed)
PPD# 1 SVD w/ PPH and 2nd degree perineal laceration Information for the patient's newborn:  Rylann, [place] Girl Thomasena [[id number]]  female    Baby Name Jocelyn Circumcision N/A   S:   Reports feeling "really good" Tolerating PO fluid and solids No nausea or vomiting Bleeding is light, no clots Pain controlled with PO meds Up ad lib / ambulatory / voiding w/o difficulty Feeding: Breast    O:   VS: BP 95/70 (BP Location: Left Arm)   Pulse 79   Temp 97.6 F (36.4 C) (Axillary)   Resp 18   Ht 5' 7.5" (1.715 m)   Wt 117.9 kg   LMP 08/16/2018   SpO2 99%   Breastfeeding Unknown   BMI 40.12 kg/m   LABS:  Recent Labs    05/07/19 0457 05/08/19 1655  WBC 9.1 26.5*  HGB 12.6 11.5*  PLT 296 276   Blood type: --/--/O POS, O POS Performed at [facility], [address], [place], [place] [address]  [phone]) Rubella: Nonimmune (09/14 0000)                      I&O: Intake/Output      01/06 0701 - 01/07 0700   I.V. (mL/kg) 0 (0)   IV Piggyback 409.8   Total Intake(mL/kg) 409.8 (3.5)   Urine (mL/kg/hr) 200 (0.1)   Stool 0   Blood 1594   Total Output 1794   Net -1384.2       Urine Occurrence 1 x   Stool Occurrence 1 x     Physical Exam: Alert and oriented X3 Lungs: Clear and unlabored Heart: regular rate and rhythm / no mumurs Abdomen: soft, non-tender, non-distended  Fundus: firm, non-tender, U-2 Perineum: well-approximated, healing Lochia: appropriate Extremities: +1, non-pitting edema, no calf pain or tenderness    A:  PPD # 1 S/P SVD w/ manual removal of placenta PPH QBL 1594    H/H stable, asymptomatic Normal exam  P:  Routine post partum orders Strict I/O Anticipate D/C on 05/10/19   Plan reviewed w/ Dr. Camillo Flaming, MSN, CNM 05/09/2019, 6:44 AM

## 2019-05-09 NOTE — Anesthesia Postprocedure Evaluation (Signed)
Anesthesia Post Note  Patient: Rita Stokes  Procedure(s) Performed: AN AD HOC LABOR EPIDURAL     Patient location during evaluation: Mother Baby Anesthesia Type: Epidural Level of consciousness: awake Pain management: satisfactory to patient Vital Signs Assessment: post-procedure vital signs reviewed and stable Respiratory status: spontaneous breathing Cardiovascular status: stable Anesthetic complications: no    Last Vitals:  Vitals:   05/08/19 2343 05/09/19 0345  BP: 119/86 95/70  Pulse: 81 79  Resp: 18 18  Temp: 36.7 C 36.4 C  SpO2:      Last Pain:  Vitals:   05/09/19 0520  TempSrc:   PainSc: 0-No pain   Pain Goal:                   [employer]

## 2019-05-09 NOTE — Lactation Note (Signed)
This note was copied from a baby's chart. Lactation Consultation Note  Patient Name: Rita Stokes [id number]'F Date: 05/09/2019 Reason for consult: Initial assessment;Primapara;1st time breastfeeding;Early term 37-38.6wks;Infant weight loss  Baby is 25 hours old  Per mom the baby last fed at 2:30 pm and breast fed well per mom with comfort. Per mom + breast changes with pregnancy. Baby asleep in the crib and not showing feeding cues.  LC showed mom hand expressing with 1-2 drops and had her apply to her nipples.  LC noted edematous areolas - and provided breast shells between feedings except when sleeping and a hand pump to pre - pump.  LC assisted mom to put the shells on and showed her why they would enhance the compressiblity of the areolas and prevent soreness.  Per mom has a DEBP - Spectra 2 at home.  LC provided the [facility] pamphlet with phone numbers and mom aware of the BFSG on line.  LC encouraged mom to call with feeding cues for a latch check.     Maternal Data Has patient been taught Hand Expression?: Yes Does the patient have breastfeeding experience prior to this delivery?: No  Feeding Feeding Type: Breast Fed  LATCH Score ( Latch Score by the [facility] )  Latch: Grasps breast easily, tongue down, lips flanged, rhythmical sucking.  Audible Swallowing: None  Type of Nipple: Everted at rest and after stimulation  Comfort (Breast/Nipple): Soft / non-tender  Hold (Positioning): No assistance needed to correctly position infant at breast.  LATCH Score: 8  Interventions Interventions: Breast feeding basics reviewed  Lactation Tools Discussed/Used WIC Program: No Pump Review: Setup, frequency, and cleaning Initiated by:: MAI Date initiated:: 05/09/19   Consult Status Consult Status: Follow-up Date: 05/10/19 Follow-up type: In-patient    Matilde Sprang Jamiah Homeyer 05/09/2019, 4:48 PM

## 2019-05-10 DIAGNOSIS — O139 Gestational [pregnancy-induced] hypertension without significant proteinuria, unspecified trimester: Secondary | ICD-10-CM | POA: Diagnosis not present

## 2019-05-10 MED ORDER — PANTOPRAZOLE SODIUM 40 MG PO TBEC: 40.0000 mg | DELAYED_RELEASE_TABLET | Freq: Two times a day (BID) | ORAL | Status: DC

## 2019-05-10 NOTE — Discharge Summary (Signed)
SVD OB Discharge Summary     Patient Name: Rita Stokes DOB: 1985-09-08 MRN: [id number]  Date of admission: 05/07/2019 Delivering MD: Essie Hart  Date of delivery: 05/08/2019 Type of delivery: SVD  Newborn Data: Sex: Baby female Live born female  Birth Weight: 9 lb 3.2 oz (4173 g) APGAR: 8, 9  Newborn Delivery   Birth date/time: 05/08/2019 15:47:00 Delivery type: Vaginal, Spontaneous      Feeding: breast and bottle Infant remaining rooming in with mother in stable condition due to bilirubin elvation.   Admitting diagnosis: PROM (premature rupture of membranes) [O42.90] Intrauterine pregnancy: [redacted]w[redacted]d     Secondary diagnosis:  Active Problems:   PROM (premature rupture of membranes)   LGA (large for gestational age) fetus affecting management of mother   Rubella non-immune status, antepartum   Childhood asthma   SVD (spontaneous vaginal delivery)   Gestational hypertension                                Complications: Hemorrhage>1050mL and ROM>24 hours                                                              Intrapartum Procedures: spontaneous vaginal delivery Postpartum Procedures: antibiotics and for manual extraction with cord avulsion and PPH for 24 hours.  Complications-Operative and Postpartum: 2nd degree perineal laceration Augmentation: Pitocin and Cytotec   History of Present Illness: Ms. Rita Stokes is a 34 y.o. female, G1P1001, who presents at [redacted]w[redacted]d weeks gestation. The patient has been followed at  [facility]ne[facility]  Her pregnancy has been complicated by:  Patient Active Problem List   Diagnosis Date Noted  . SVD (spontaneous vaginal delivery) 05/10/2019  . Gestational hypertension 05/10/2019  . PROM (premature rupture of membranes) 05/07/2019  . LGA (large for gestational age) fetus affecting management of mother 05/07/2019  . Rubella non-immune status, antepartum 05/07/2019  . Childhood asthma 05/07/2019    Hospital  course:  Onset of Labor With Vaginal Delivery     34 y.o. yo G1P1001 at [redacted]w[redacted]d was admitted for PROM  on 05/07/2019. Patient had an uncomplicated labor course as follows:  Membrane Rupture Time/Date: 1:15 AM ,05/07/2019   Intrapartum Procedures: Episiotomy: None [1]                                         Lacerations:  2nd degree [3];Perineal [11]  Patient had a delivery of a Viable infant. 05/08/2019  Information for the patient's newborn:  Rita Stokes [[id number]]  Delivery Method: Vaginal, Spontaneous(Filed from Delivery Summary)     Pateint had an uncomplicated postpartum course.  She is ambulating, tolerating a regular diet, passing flatus, and urinating well. Patient is discharged home in stable condition on 05/10/19.  Postpartum Day # 2 : S/P NSVD due to PROM then prolong labor and PROM, had PPH of QBl 1500, with hgb stable at 10.2, asymptomatic, pt received methergine PP, there was also a cord avulsion with manual extraction, pt received 24 hours PP abx and has been afebrile. Pt haso developed GHTN during labor with elevated BP, no meds, normotensive now, asymptomatic,  negative and unremarkable labs. Patient up ad lib, denies syncope or dizziness. Reports consuming regular diet without issues and denies N/V. Patient reports 0 bowel movement + passing flatus.  Denies issues with urination and reports bleeding is "lighter."  Patient is breast and bottle feeding and reports going well.  Desires undecided for postpartum contraception.  Pain is being appropriately managed with use of po meds. Pt stable for discharge, newborn will remain rooming in with pt due to billi lights.    Physical exam  Vitals:   05/09/19 0829 05/09/19 1358 05/09/19 2133 05/10/19 0614  BP: (!) 88/50 102/61 (!) 89/51 122/68  Pulse: 86 91 86 88  Resp: 18 18 19 18   Temp: 98 F (36.7 C) 97.9 F (36.6 C) 98.1 F (36.7 C) 97.8 F (36.6 C)  TempSrc: Axillary Axillary Oral Oral  SpO2: 99%  99%   Weight:      Height:        General: alert, cooperative and no distress Lochia: appropriate Uterine Fundus: firm Incision: Healing well with no significant drainage, No significant erythema, Dressing is clean, dry, and intact, honeycomb dressing CDI Perineum: Approximate, no hematomas noted.  DVT Evaluation: No evidence of DVT seen on physical exam. Negative Homan's sign. No cords or calf tenderness. 1+ pitting edema noted to lower extremities, ,no clonus and 2+ Patellar DTR.   Labs: Lab Results  Component Value Date   WBC 22.3 (H) 05/09/2019   HGB 10.2 (L) 05/09/2019   HCT 30.5 (L) 05/09/2019   MCV 85.9 05/09/2019   PLT 259 05/09/2019   CMP Latest Ref Rng & Units 05/07/2019  Glucose 70 - 99 mg/dL 93  BUN 6 - 20 mg/dL 12  Creatinine 07/05/2019 - 1.74 mg/dL 0.81  Sodium 4.48 - 185 mmol/L 135  Potassium 3.5 - 5.1 mmol/L 3.8  Chloride 98 - 111 mmol/L 106  CO2 22 - 32 mmol/L 17(L)  Calcium 8.9 - 10.3 mg/dL 9.8  Total Protein 6.5 - 8.1 g/dL 6.0(L)  Total Bilirubin 0.3 - 1.2 mg/dL 0.6  Alkaline Phos 38 - 126 U/L 123  AST 15 - 41 U/L 18  ALT 0 - 44 U/L 16    Date of discharge: 05/10/2019 Discharge Diagnoses: Term Pregnancy-delivered and PROM x>24 hours hours Discharge instruction: per After Visit Summary and "Baby and Me Booklet".  After visit meds:   Activity:           unrestricted and pelvic rest Advance as tolerated. Pelvic rest for 6 weeks.  Diet:                routine Medications: PNV, Ibuprofen and Colace Postpartum contraception: Undecided Condition:  Pt discharge to rooming in with baby in stable due to elevated bilirubin.  GHTN: Monitor BP, report s/sx of elevated BP, HA< RUQ pain or vision changes  Meds: Allergies as of 05/10/2019   No Known Allergies     Medication List    TAKE these medications   calcium carbonate 500 MG chewable tablet Commonly known as: TUMS - dosed in mg elemental calcium Chew 1 tablet by mouth daily.   famotidine 10 MG tablet Commonly known as: PEPCID Take 10 mg  by mouth 2 (two) times daily.   multivitamin-prenatal 27-0.8 MG Tabs tablet Take 1 tablet by mouth daily at 12 noon.   VITAMIN B-6 PO Take by mouth.   VITAMIN D PO Take by mouth.       Discharge Follow Up:  Follow-up Information    [facility] Follow up.   Specialty: Obstetrics and Gynecology Why: Please make a 1 week F/U for BP check and 6 week PP follow up  Contact information: [address] [place] [address] [place], NP-C, CNM 05/10/2019, 10:05 AM  Noralyn Pick, [place]

## 2019-05-10 NOTE — Progress Notes (Signed)
The patient is receiving Protonix by the intravenous route.  Based on criteria approved by the Pharmacy and Therapeutics Committee and the Medical Executive Committee, the medication is being converted to the equivalent oral dose form.  These criteria include: -No active GI bleeding -Able to tolerate diet of full liquids (or better) or tube feeding -Able to tolerate other medications by the oral or enteral route  If you have any questions about this conversion, please contact the Pharmacy Department (phone [phone]).  Thank you.  Natasha Bence 05/10/2019

## 2019-05-10 NOTE — Lactation Note (Signed)
This note was copied from a baby's chart. Lactation Consultation Note  Patient Name: Rita Stokes [id number]'R Date: 05/10/2019 Reason for consult: Follow-up assessment;Primapara;1st time breastfeeding;Early term 37-38.6wks;Infant weight loss;Other (Comment)(7 % weight loss)  Baby is 42 hours old  Baby stirring in the crib , LC offered to assist to change the diaper ( wet and transitional stool)  LC assisted to latch with mom photo tx light due to positioning.  Baby latched easily and LC eased chin for depth , increased swallows noted and still feeding at 10 mins. Per mom comfortable.  LC reviewed the LC plan:  Breast shells between feedings except when sleeping  Prior to every feeding moist heat , breast massage, hand express, pre - pump with hand pump and latch with compressions until swallows.  Firm support, offer both breast.  Baby is back under both lights and supplement with 20 ml of EBM or formula.  MBURN ( Chloe Winecoff ) to set up the DEBP for post pumping.  Both mom and dad receptive to the [facility] plan of care.     Maternal Data Has patient been taught Hand Expression?: Yes  Feeding Feeding Type: Breast Fed  LATCH Score Latch: Grasps breast easily, tongue down, lips flanged, rhythmical sucking.  Audible Swallowing: A few with stimulation(increased to 2)  Type of Nipple: Everted at rest and after stimulation  Comfort (Breast/Nipple): Soft / non-tender  Hold (Positioning): Assistance needed to correctly position infant at breast and maintain latch.  LATCH Score: 8  Interventions Interventions: Breast feeding basics reviewed;Assisted with latch;Skin to skin;Breast massage;Breast compression;Adjust position;Support pillows;Position options  Lactation Tools Discussed/Used Tools: Shells;Pump(will have the MBURN start DEBP) Flange Size: 24 Shell Type: Inverted Breast pump type: Manual Pump Review: Milk Storage   Consult Status Consult Status: Follow-up Date:  05/11/19 Follow-up type: In-patient    Rita Stokes 05/10/2019, 10:37 AM

## 2019-05-11 ENCOUNTER — Ambulatory Visit: Payer: Self-pay

## 2019-05-11 NOTE — Lactation Note (Signed)
This note was copied from a baby's chart. Lactation Consultation Note  Patient Name: Girl Ayelet Gruenewald KYHCW'C Date: 05/11/2019 Reason for consult: Follow-up assessment;Early term 37-38.6wks;Primapara;1st time breastfeeding;Hyperbilirubinemia;Infant weight loss  P1 mother whose infant is now 77 hours old.  This is an ETI at 37+6 weeks.  Baby is under double phototherapy with a bilirubin level of 11.6 mg/dl at 61 hours.  Eye goggles in place and parents stated they have been very diligent about keeping her under her phototherapy.  Baby has a 9% weight loss this morning.  Mother has been breast feeding and supplementing with formula after breast feeding.  She has been pumping and, this morning, has been able to obtain approximately 5 mls of EBM.  Discussed volume supplementation guidelines and asked parents to supplement with 30+ mls after every feeding today.  Discussed keeping the total feeding time between breast and bottle to 30 minutes or less.  Suggested they periodically remove the eye goggles so baby can open her eyes and gaze at mother.  Parents excited to hear this and suggested mother latch baby to the breast and cover her baby with a blanket.  Once covered she could remove the goggles for a few minutes during feedings.  Encouraged mother to allow baby more formula supplementation if she desires.  Discussed voids/stools.  Mother will continue to pump after every feeding.  She will feed back any EBM she obtains to baby.    Parents are hoping to be discharged today; discussed possible scenarios and will wait for pediatrician's arrival.  Parents have a 1200 return pediatric visit for Monday, January 11.    Parents are very receptive to learning and have done an excellent job of caring for their daughter with guidance from staff members.  Parents supportive of each other and work well together.  Allowed time for discussion regarding baby care and family will call me for any further  questions/concerns.  Mother will be diligent with feedings and supplementations.  Mother has a DEBP for home use.  RN updated.       Maternal Data Formula Feeding for Exclusion: No Has patient been taught Hand Expression?: Yes Does the patient have breastfeeding experience prior to this delivery?: No  Feeding    LATCH Score                   Interventions    Lactation Tools Discussed/Used Tools: Shells;Pump;Nipple Shields Nipple shield size: 24 Flange Size: 24 Shell Type: Inverted Breast pump type: Double-Electric Breast Pump;Manual Pump Review: Setup, frequency, and cleaning(Reviewed)   Consult Status Consult Status: Follow-up Date: 05/12/19 Follow-up type: In-patient    Gabrielle Mester R Anslee Micheletti 05/11/2019, 10:42 AM

## 2019-12-23 LAB — RPR: RPR Ser Ql: NONREACTIVE — AB

## 2020-04-04 ENCOUNTER — Other Ambulatory Visit: Payer: 59

## 2020-04-04 DIAGNOSIS — Z20822 Contact with and (suspected) exposure to covid-19: Secondary | ICD-10-CM

## 2020-04-06 LAB — SARS-COV-2, NAA 2 DAY TAT

## 2020-04-06 LAB — NOVEL CORONAVIRUS, NAA: SARS-CoV-2, NAA: DETECTED — AB

## 2020-04-07 ENCOUNTER — Encounter: Payer: Self-pay | Admitting: Physician Assistant

## 2020-04-07 ENCOUNTER — Other Ambulatory Visit: Payer: Self-pay | Admitting: Physician Assistant

## 2020-04-07 DIAGNOSIS — U071 COVID-19: Secondary | ICD-10-CM

## 2020-04-07 NOTE — Progress Notes (Signed)
I connected by phone with Rita Stokes on 04/07/2020 at 4:11 PM to discuss the potential use of a new treatment for mild to moderate COVID-19 viral infection in non-hospitalized patients.  This patient is a 34 y.o. female that meets the FDA criteria for Emergency Use Authorization of COVID monoclonal antibody sotrovimab, casirivimab/imdevimab or bamlamivimab/estevimab.  Has a (+) direct SARS-CoV-2 viral test result  Has mild or moderate COVID-19   Is NOT hospitalized due to COVID-19  Is within 10 days of symptom onset  Has at least one of the high risk factor(s) for progression to severe COVID-19 and/or hospitalization as defined in EUA.  Specific high risk criteria : BMI > 25 and Other high risk medical condition per CDC:  high SVI   I have spoken and communicated the following to the patient or parent/caregiver regarding COVID monoclonal antibody treatment:  1. FDA has authorized the emergency use for the treatment of mild to moderate COVID-19 in adults and pediatric patients with positive results of direct SARS-CoV-2 viral testing who are 38 years of age and older weighing at least 40 kg, and who are at high risk for progressing to severe COVID-19 and/or hospitalization.  2. The significant known and potential risks and benefits of COVID monoclonal antibody, and the extent to which such potential risks and benefits are unknown.  3. Information on available alternative treatments and the risks and benefits of those alternatives, including clinical trials.  4. Patients treated with COVID monoclonal antibody should continue to self-isolate and use infection control measures (e.g., wear mask, isolate, social distance, avoid sharing personal items, clean and disinfect "high touch" surfaces, and frequent handwashing) according to CDC guidelines.   5. The patient or parent/caregiver has the option to accept or refuse COVID monoclonal antibody treatment.  After reviewing this information with  the patient, the patient has agreed to receive one of the available covid 19 monoclonal antibodies and will be provided an appropriate fact sheet prior to infusion.  Sx onset 12/4. Set up for infusion on 12/9 @ 11:30am. Directions given to [facility]. Pt is aware that insurance will be charged an infusion fee. Pt is unvaccinated. + in epic.   Cline Crock 04/07/2020 4:11 PM

## 2020-04-09 ENCOUNTER — Ambulatory Visit (HOSPITAL_COMMUNITY)
Admission: RE | Admit: 2020-04-09 | Discharge: 2020-04-09 | Disposition: A | Discharge status: Home / Self Care | Payer: 59 | Source: Ambulatory Visit | Attending: Pulmonary Disease | Admitting: Pulmonary Disease

## 2020-04-09 DIAGNOSIS — U071 COVID-19: Secondary | ICD-10-CM | POA: Diagnosis present

## 2020-04-09 MED ORDER — ALBUTEROL SULFATE HFA 108 (90 BASE) MCG/ACT IN AERS: 2.0000 | INHALATION_SPRAY | Freq: Once | RESPIRATORY_TRACT | Status: DC | PRN

## 2020-04-09 MED ORDER — SODIUM CHLORIDE 0.9 % IV SOLN
1200.0000 mg | Freq: Once | INTRAVENOUS | Status: AC
  Administered 2020-04-09: 1200 mg via INTRAVENOUS

## 2020-04-09 MED ORDER — DIPHENHYDRAMINE HCL 50 MG/ML IJ SOLN: 50.0000 mg | Freq: Once | INTRAMUSCULAR | Status: DC | PRN

## 2020-04-09 MED ORDER — FAMOTIDINE IN NACL 20-0.9 MG/50ML-% IV SOLN: 20.0000 mg | Freq: Once | INTRAVENOUS | Status: DC | PRN

## 2020-04-09 MED ORDER — SODIUM CHLORIDE 0.9 % IV SOLN: INTRAVENOUS | Status: DC | PRN

## 2020-04-09 MED ORDER — EPINEPHRINE 0.3 MG/0.3ML IJ SOAJ: 0.3000 mg | Freq: Once | INTRAMUSCULAR | Status: DC | PRN

## 2020-04-09 MED ORDER — METHYLPREDNISOLONE SODIUM SUCC 125 MG IJ SOLR: 125.0000 mg | Freq: Once | INTRAMUSCULAR | Status: DC | PRN

## 2020-04-09 NOTE — Discharge Instructions (Signed)
10 Things You Can Do to Manage Your COVID-19 Symptoms at Home If you have possible or confirmed COVID-19: 1. Stay home from work and school. And stay away from other public places. If you must go out, avoid using any kind of public transportation, ridesharing, or taxis. 2. Monitor your symptoms carefully. If your symptoms get worse, call your healthcare provider immediately. 3. Get rest and stay hydrated. 4. If you have a medical appointment, call the healthcare provider ahead of time and tell them that you have or may have COVID-19. 5. For medical emergencies, call 911 and notify the dispatch personnel that you have or may have COVID-19. 6. Cover your cough and sneezes with a tissue or use the inside of your elbow. 7. Wash your hands often with soap and water for at least 20 seconds or clean your hands with an alcohol-based hand sanitizer that contains at least 60% alcohol. 8. As much as possible, stay in a specific room and away from other people in your home. Also, you should use a separate bathroom, if available. If you need to be around other people in or outside of the home, wear a mask. 9. Avoid sharing personal items with other people in your household, like dishes, towels, and bedding. 10. Clean all surfaces that are touched often, like counters, tabletops, and doorknobs. Use household cleaning sprays or wipes according to the label instructions. cdc.gov/coronavirus 10/31/2018 This information is not intended to replace advice given to you by your health care provider. Make sure you discuss any questions you have with your health care provider. Document Revised: 04/04/2019 Document Reviewed: 04/04/2019 Elsevier Patient Education  2020 Elsevier Inc. What types of side effects do monoclonal antibody drugs cause?  Common side effects  In general, the more common side effects caused by monoclonal antibody drugs include: . Allergic reactions, such as hives or itching . Flu-like signs and  symptoms, including chills, fatigue, fever, and muscle aches and pains . Nausea, vomiting . Diarrhea . Skin rashes . Low blood pressure   The CDC is recommending patients who receive monoclonal antibody treatments wait at least 90 days before being vaccinated.  Currently, there are no data on the safety and efficacy of mRNA COVID-19 vaccines in persons who received monoclonal antibodies or convalescent plasma as part of COVID-19 treatment. Based on the estimated half-life of such therapies as well as evidence suggesting that reinfection is uncommon in the 90 days after initial infection, vaccination should be deferred for at least 90 days, as a precautionary measure until additional information becomes available, to avoid interference of the antibody treatment with vaccine-induced immune responses. If you have any questions or concerns after the infusion please call the Advanced Practice Provider on call at [phone]. This number is ONLY intended for your use regarding questions or concerns about the infusion post-treatment side-effects.  Please do not provide this number to others for use. For return to work notes please contact your primary care provider.   If someone you know is interested in receiving treatment please have them call the COVID hotline at [phone].   

## 2020-04-09 NOTE — Progress Notes (Signed)
  Diagnosis: COVID-19  Physician: Patrick Wright, MD  Procedure: Covid Infusion Clinic Med: casirivimab\imdevimab infusion - Provided patient with casirivimab\imdevimab fact sheet for patients, parents and caregivers prior to infusion.  Complications: No immediate complications noted.  Discharge: Discharged home   Guillermina Shaft N Azarel Banner 04/09/2020   

## 2020-04-09 NOTE — Progress Notes (Signed)
Patient reviewed Fact Sheet for Patients, Parents, and Caregivers for Emergency Use Authorization (EUA) of Sotrovimab for the Treatment of Coronavirus. Patient also reviewed and is agreeable to the estimated cost of treatment. Patient is agreeable to proceed.   

## 2021-03-30 ENCOUNTER — Ambulatory Visit (INDEPENDENT_AMBULATORY_CARE_PROVIDER_SITE_OTHER): Payer: 59 | Admitting: Advanced Practice Midwife

## 2021-03-30 ENCOUNTER — Encounter: Payer: Self-pay | Admitting: Advanced Practice Midwife

## 2021-03-30 ENCOUNTER — Other Ambulatory Visit: Payer: Self-pay

## 2021-03-30 ENCOUNTER — Other Ambulatory Visit (HOSPITAL_COMMUNITY)
Admission: RE | Admit: 2021-03-30 | Discharge: 2021-03-30 | Disposition: A | Discharge status: Home / Self Care | Payer: 59 | Source: Ambulatory Visit | Attending: Advanced Practice Midwife | Admitting: Advanced Practice Midwife

## 2021-03-30 VITALS — BP 117/80 | HR 79 | Resp 16 | Ht 67.0 in | Wt 245.0 lb

## 2021-03-30 DIAGNOSIS — Z3169 Encounter for other general counseling and advice on procreation: Secondary | ICD-10-CM | POA: Diagnosis not present

## 2021-03-30 DIAGNOSIS — Z01419 Encounter for gynecological examination (general) (routine) without abnormal findings: Secondary | ICD-10-CM | POA: Insufficient documentation

## 2021-03-30 DIAGNOSIS — Z30432 Encounter for removal of intrauterine contraceptive device: Secondary | ICD-10-CM

## 2021-03-30 MED ORDER — CONCEPT OB 130-92.4-1 MG PO CAPS: 1.0000 | ORAL_CAPSULE | Freq: Every day | ORAL | 11 refills | Status: DC

## 2021-03-30 NOTE — Progress Notes (Signed)
Subjective:     Rita Stokes is a 35 y.o. female here at [facility] for a routine exam and IUD removal.  Current complaints: none.  Personal health questionnaire reviewed: yes.  Do you have a primary care provider? yes Do you feel safe at home? yes    Health Maintenance Due  Topic Date Due   COVID-19 Vaccine (1) Never done   Pneumococcal Vaccine 20-56 Years old (1 - PCV) Never done   Hepatitis C Screening  Never done   PAP SMEAR-Modifier  Never done   INFLUENZA VACCINE  Never done     Risk factors for chronic health problems: Smoking: Alchohol/how much: Pt BMI: Body mass index is 38.37 kg/m.   Gynecologic History No LMP recorded. (Menstrual status: IUD). Contraception: IUD Last Pap: ? 2020. Results were: normal per pt Last mammogram: Pt had diagnostic mammogram in 2021 that was normal.    Obstetric History OB History  Gravida Para Term Preterm AB Living  1 1 1     1   SAB IAB Ectopic Multiple Live Births        0 1    # Outcome Date GA Lbr Len/2nd Weight Sex Delivery Anes PTL Lv  1 Term 05/08/19 [redacted]w[redacted]d 06:32 / 02:45 9 lb 3.2 oz (4.173 kg) F Vag-Spont EPI, Local  LIV     The following portions of the patient's history were reviewed and updated as appropriate: allergies, current medications, past family history, past medical history, past social history, past surgical history, and problem list.  Review of Systems Pertinent items noted in HPI and remainder of comprehensive ROS otherwise negative.    Objective:   BP 117/80   Pulse 79   Resp 16   Ht 5\' 7"  (1.702 m)   Wt 245 lb (111.1 kg)   BMI 38.37 kg/m  VS reviewed, nursing note reviewed,  Constitutional: well developed, well nourished, no distress HEENT: normocephalic HEART: normal rate, heart sounds, regular rhythm RESP: normal effort, lung sounds clear and equal bilaterally Breast Exam:  Deferred with low risks and shared decision making, discussed recommendation to start mammogram between 40-50  yo/  Abdomen: soft Neuro: alert and oriented x 3 Skin: warm, dry Psych: affect normal Pelvic exam: Performed: Cervix pink, visually closed, without lesion, scant white creamy discharge, vaginal walls and external genitalia normal Bimanual exam: Cervix 0/long/high, firm, anterior, neg CMT, uterus nontender, nonenlarged, adnexa without tenderness, enlargement, or mass     IUD Removal  Patient was in the dorsal lithotomy position, normal external genitalia was noted.  A speculum was placed in the patient's vagina, normal discharge was noted, no lesions. The multiparous cervix was visualized, no lesions, no abnormal discharge.  The strings of the IUD were grasped and pulled using ring forceps. The IUD was removed in its entirety. Patient tolerated the procedure well.    Patient plans for pregnancy soon and she was told to avoid teratogens, take PNV and folic acid.  Routine preventative health maintenance measures emphasized.     Assessment/Plan:     1. Well woman exam --Doing well, no gyn concerns. Light regular menses with Mirena IUD.  --Desires pregnancy so desires IUD removal.  - Cytology - PAP( [place])  2. Encounter for IUD removal --IUD removed without difficulty, see procedure note above.   3. Encounter for preconception consultation --Discussed lifestyle changes, recommend PNV or folic acid daily.  -- Rx for PNV  Follow up in: 1  year  or as needed.   [redacted]w[redacted]d  [place], CNM 4:04 PM

## 2021-04-01 LAB — CYTOLOGY - PAP
Adequacy: ABSENT
Comment: NEGATIVE
Diagnosis: NEGATIVE
High risk HPV: NEGATIVE

## 2021-04-20 ENCOUNTER — Encounter: Payer: Self-pay | Admitting: Advanced Practice Midwife

## 2021-04-23 ENCOUNTER — Other Ambulatory Visit: Payer: Self-pay | Admitting: Advanced Practice Midwife

## 2021-04-23 DIAGNOSIS — Z3169 Encounter for other general counseling and advice on procreation: Secondary | ICD-10-CM

## 2021-04-23 MED ORDER — VITAFOL GUMMIES 3.33-0.333-34.8 MG PO CHEW: 3.0000 | CHEWABLE_TABLET | Freq: Every day | ORAL | 11 refills | Status: DC

## 2021-05-02 NOTE — L&D Delivery Note (Signed)
Delivery Note Rita Stokes is a 36 y.o. G2P1001 at [redacted]w[redacted]d admitted for IOL d/t GHTN.   GBS Status: Negative/-- (08/24 0000) Maximum Maternal Temperature: 98.6  Labor course: Initial SVE: 0.5/th/ballotable. Augmentation with: AROM, Pitocin, and Cytotec. She then progressed to complete.  ROM: 3h 71m with clear fluid  Birth: Began pushing @ 1356 as I held back anterior lip, vtx -2 and pushed to 0 station. Unable to trace FHR well w/ external monitor, IFSE applied but was not reading, so went back to external monitor- FHR 150s. Dr. Macon Large called to be in room for delivery d/t h/o LGA, suspected LGA this pregnancy and complete w/ high station. Pt pushed for a total of 6 contractions, and at 1411 a viable female was delivered via spontaneous vaginal delivery (Presentation: ROA). Nuchal cord present: Yes, loose, reduced prior to birth of shoulders.  Shoulders and body delivered in usual fashion. TXA given prophylactically d/t h/o PPH. Infant placed directly on mom's abdomen for bonding/skin-to-skin, baby dried and stimulated. Cord clamped x 2 after 1 minute and cut by FOB.  Cord blood collected. Pitocin infused rapidly IV per protocol. Placenta not detaching w/ gentle cord traction and maternal pushing efforts. After , manual extraction attempt performed by me, able to feel entire placenta, but unable to reach high enough to detach it from uterus effectively. Current QBL 693 and not actively bleeding. Dr. Macon Large called to come to room to remove placenta, she attempted, but very high/fundal and unable to remove (please read her note). She discussed redosing epidural and trying to perform in room vs. D&C in OR, pt opts for D&C. Crossmatched x 2units. Anesthesia in room.   Intrapartum complications:  retained placenta, PPH Anesthesia:  epidural Episiotomy: none Lacerations:  1st degree Suture Repair:  not repaired in L&D, see op note Total EBL (mL): 693  Infant: APGAR (1 MIN): 8   APGAR (5 MINS): 9    APGAR (10 MINS):    Infant weight: pending  Mom to OR.  Baby to Couplet care / Skin to Skin w/ dad.  Plans to Breastfeed Contraception: IUD outpatient Circumcision: wants inpatient  Note sent to [facility]: KV for pp visit.  Cheral Marker CNM, [facility] 01/03/2022 3:13 PM

## 2021-05-18 ENCOUNTER — Encounter: Payer: Self-pay | Admitting: Advanced Practice Midwife

## 2021-06-03 ENCOUNTER — Encounter: Payer: Self-pay | Admitting: Advanced Practice Midwife

## 2021-06-16 ENCOUNTER — Encounter: Payer: Self-pay | Admitting: Advanced Practice Midwife

## 2021-06-17 ENCOUNTER — Other Ambulatory Visit: Payer: Self-pay

## 2021-06-17 ENCOUNTER — Inpatient Hospital Stay (HOSPITAL_COMMUNITY)
Admission: AD | Admit: 2021-06-17 | Discharge: 2021-06-17 | Disposition: A | Discharge status: Home / Self Care | Payer: 59 | Attending: Obstetrics & Gynecology | Admitting: Obstetrics & Gynecology

## 2021-06-17 ENCOUNTER — Encounter (HOSPITAL_COMMUNITY): Payer: Self-pay | Admitting: *Deleted

## 2021-06-17 DIAGNOSIS — Z3A08 8 weeks gestation of pregnancy: Secondary | ICD-10-CM | POA: Diagnosis not present

## 2021-06-17 DIAGNOSIS — O99211 Obesity complicating pregnancy, first trimester: Secondary | ICD-10-CM | POA: Diagnosis not present

## 2021-06-17 DIAGNOSIS — O468X1 Other antepartum hemorrhage, first trimester: Secondary | ICD-10-CM

## 2021-06-17 DIAGNOSIS — O208 Other hemorrhage in early pregnancy: Secondary | ICD-10-CM | POA: Insufficient documentation

## 2021-06-17 DIAGNOSIS — O209 Hemorrhage in early pregnancy, unspecified: Secondary | ICD-10-CM | POA: Diagnosis not present

## 2021-06-17 DIAGNOSIS — O418X10 Other specified disorders of amniotic fluid and membranes, first trimester, not applicable or unspecified: Secondary | ICD-10-CM

## 2021-06-17 HISTORY — DX: Anxiety disorder, unspecified: F41.9

## 2021-06-17 LAB — URINALYSIS, ROUTINE W REFLEX MICROSCOPIC
Bilirubin Urine: NEGATIVE
Glucose, UA: NEGATIVE mg/dL
Ketones, ur: NEGATIVE mg/dL
Leukocytes,Ua: NEGATIVE
Nitrite: NEGATIVE
Protein, ur: NEGATIVE mg/dL
Specific Gravity, Urine: 1.003 — ABNORMAL LOW (ref 1.005–1.030)
pH: 8 (ref 5.0–8.0)

## 2021-06-17 LAB — POCT PREGNANCY, URINE: Preg Test, Ur: POSITIVE — AB

## 2021-06-17 NOTE — MAU Provider Note (Signed)
History     [id number]  Arrival date and time: 06/17/21 4010    Chief Complaint  Patient presents with   Vaginal Bleeding     HPI Rita Stokes is a 36 y.o. at [redacted]w[redacted]d by unsure LMP with PMHx notable for gHTN, who presents for vaginal bleeding.   Last night began to have mild vaginal bleeding like the beginning of her period It continued into this morning and came to get evaluated Denies any other symptoms, no fever, abdominal pain, burning or pain with urination, or vaginal discharge      OB History     Gravida  2   Para  1   Term  1   Preterm      AB      Living  1      SAB      IAB      Ectopic      Multiple  0   Live Births  1           Past Medical History:  Diagnosis Date   Anxiety    Medical history non-contributory    Morbid obesity (HCC)     Past Surgical History:  Procedure Laterality Date   CYSTECTOMY      Family History  Problem Relation Age of Onset   Arthritis Mother    Arthritis/Rheumatoid Mother    Macular degeneration Mother     Social History   Socioeconomic History   Marital status: Married    Spouse name: Not on file   Number of children: Not on file   Years of education: Not on file   Highest education level: Not on file  Occupational History   Not on file  Tobacco Use   Smoking status: Never   Smokeless tobacco: Never  Vaping Use   Vaping Use: Never used  Substance and Sexual Activity   Alcohol use: Never   Drug use: Never   Sexual activity: Yes  Other Topics Concern   Not on file  Social History Narrative   Not on file   Social Determinants of Health   Financial Resource Strain: Not on file  Food Insecurity: Not on file  Transportation Needs: Not on file  Physical Activity: Not on file  Stress: Not on file  Social Connections: Not on file  Intimate Partner Violence: Not on file    No Known Allergies  No current facility-administered medications on file prior to encounter.   Current  Outpatient Medications on File Prior to Encounter  Medication Sig Dispense Refill   cetirizine (ZYRTEC) 10 MG tablet Take 10 mg by mouth daily.     Prenatal Vit-Fe Phos-FA-Omega (VITAFOL GUMMIES) 3.33-0.333-34.8 MG CHEW Chew 3 tablets by mouth daily. 90 tablet 11   VITAMIN D PO Take by mouth.     calcium carbonate (TUMS - DOSED IN MG ELEMENTAL CALCIUM) 500 MG chewable tablet Chew 1 tablet by mouth daily. (Patient not taking: Reported on 03/30/2021)     famotidine (PEPCID) 10 MG tablet Take 10 mg by mouth 2 (two) times daily. (Patient not taking: Reported on 03/30/2021)     levonorgestrel (MIRENA) 20 MCG/DAY IUD 1 each by Intrauterine route once.     Prenatal Vit-Fe Fumarate-FA (MULTIVITAMIN-PRENATAL) 27-0.8 MG TABS tablet Take 1 tablet by mouth daily at 12 noon. (Patient not taking: Reported on 03/30/2021)     Pyridoxine HCl (VITAMIN B-6 PO) Take by mouth. (Patient not taking: Reported on 03/30/2021)       ROS Pertinent  positives and negative per HPI, all others reviewed and negative  Physical Exam   BP 131/76 (BP Location: Right Arm)    Pulse 72    Temp 97.7 F (36.5 C) (Oral)    Resp 20    Ht 5\' 8"  (1.727 m)    Wt 112 kg    LMP 04/20/2021    SpO2 100%    BMI 37.54 kg/m   Patient Vitals for the past 24 hrs:  BP Temp Temp src Pulse Resp SpO2 Height Weight  06/17/21 0838 131/76 97.7 F (36.5 C) Oral 72 20 100 % -- --  06/17/21 0833 -- -- -- -- -- -- 5\' 8"  (1.727 m) 112 kg    Physical Exam Vitals reviewed.  Constitutional:      General: She is not in acute distress.    Appearance: She is well-developed. She is not diaphoretic.  Eyes:     General: No scleral icterus. Pulmonary:     Effort: Pulmonary effort is normal. No respiratory distress.  Abdominal:     General: There is no distension.     Palpations: Abdomen is soft.     Tenderness: There is no abdominal tenderness. There is no guarding or rebound.  Skin:    General: Skin is warm and dry.  Neurological:     Mental  Status: She is alert.     Coordination: Coordination normal.     Cervical Exam    Bedside Ultrasound Pt informed that the ultrasound is considered a limited OB ultrasound and is not intended to be a complete ultrasound exam.  Patient also informed that the ultrasound is not being completed with the intent of assessing for fetal or placental anomalies or any pelvic abnormalities.  Explained that the purpose of todays ultrasound is to assess for  viability.  Patient acknowledges the purpose of the exam and the limitations of the study.    My interpretation: viable IUP with FHR 174 bpm by m mode. Yolk sac present. Subjectively normal fluid. Possible corpus luteum cyst in R ovary. L ovary normal. Possible subchorionic hemorrhage.     Labs Results for orders placed or performed during the hospital encounter of 06/17/21 (from the past 24 hour(s))  Pregnancy, urine POC     Status: Abnormal   Collection Time: 06/17/21  8:47 AM  Result Value Ref Range   Preg Test, Ur POSITIVE (A) NEGATIVE  Urinalysis, Routine w reflex microscopic Urine, Clean Catch     Status: Abnormal   Collection Time: 06/17/21  8:57 AM  Result Value Ref Range   Color, Urine STRAW (A) YELLOW   APPearance CLEAR CLEAR   Specific Gravity, Urine 1.003 (L) 1.005 - 1.030   pH 8.0 5.0 - 8.0   Glucose, UA NEGATIVE NEGATIVE mg/dL   Hgb urine dipstick MODERATE (A) NEGATIVE   Bilirubin Urine NEGATIVE NEGATIVE   Ketones, ur NEGATIVE NEGATIVE mg/dL   Protein, ur NEGATIVE NEGATIVE mg/dL   Nitrite NEGATIVE NEGATIVE   Leukocytes,Ua NEGATIVE NEGATIVE   RBC / HPF 0-5 0 - 5 RBC/hpf   WBC, UA 0-5 0 - 5 WBC/hpf   Bacteria, UA RARE (A) NONE SEEN   Squamous Epithelial / LPF 0-5 0 - 5    Imaging No results found.  MAU Course  Procedures Lab Orders         Urinalysis, Routine w reflex microscopic Urine, Clean Catch         Pregnancy, urine POC    No orders of the defined types were  placed in this encounter.  Imaging Orders  No  imaging studies ordered today    MDM moderate  Assessment and Plan  #Vaginal bleeding in first trimester of pregnancy #[redacted] weeks gestation of pregnancy #Subchorionic hemorrhage Discussed with patient that likely source of bleeding is small [facility] seen on bedside US, but other findings are normal and reassuring with viable IUP seen. Discussed that bleeding in the first trimester is common, and that 80-90% of patients go on to have a normal pregnancy. There is a 10-20% chance of miscarriage, but there are no proven interventions to modify this risk. Pelvic rest can be considered but has not been shown to have benefit. All questions answered.   #FWB FHR 174 bpm by [place] m mode   Dispo: discharged to home in stable condition.    Venora Maples, MD/MPH 06/17/21 9:47 AM  Allergies as of 06/17/2021   No Known Allergies      Medication List     STOP taking these medications    levonorgestrel 20 MCG/DAY Iud Commonly known as: MIRENA       TAKE these medications    calcium carbonate 500 MG chewable tablet Commonly known as: TUMS - dosed in mg elemental calcium Chew 1 tablet by mouth daily.   cetirizine 10 MG tablet Commonly known as: ZYRTEC Take 10 mg by mouth daily.   famotidine 10 MG tablet Commonly known as: PEPCID Take 10 mg by mouth 2 (two) times daily.   multivitamin-prenatal 27-0.8 MG Tabs tablet Take 1 tablet by mouth daily at 12 noon.   Vitafol Gummies 3.33-0.333-34.8 MG Chew Chew 3 tablets by mouth daily.   VITAMIN B-6 PO Take by mouth.   VITAMIN D PO Take by mouth.

## 2021-06-17 NOTE — MAU Note (Signed)
Presents stating she's [redacted] weeks pregnant and had bright red VB last night and today bleeding is "brownish".  States VB today is spotting, but last night would have saturated panty liner.  LMP 04/20/2021.

## 2021-06-25 ENCOUNTER — Ambulatory Visit (INDEPENDENT_AMBULATORY_CARE_PROVIDER_SITE_OTHER): Payer: 59 | Admitting: Obstetrics and Gynecology

## 2021-06-25 ENCOUNTER — Other Ambulatory Visit (HOSPITAL_COMMUNITY)
Admission: RE | Admit: 2021-06-25 | Discharge: 2021-06-25 | Disposition: A | Discharge status: Home / Self Care | Payer: 59 | Source: Ambulatory Visit | Attending: Obstetrics and Gynecology | Admitting: Obstetrics and Gynecology

## 2021-06-25 ENCOUNTER — Encounter: Payer: Self-pay | Admitting: Obstetrics and Gynecology

## 2021-06-25 ENCOUNTER — Other Ambulatory Visit: Payer: Self-pay

## 2021-06-25 VITALS — BP 118/69 | HR 75 | Wt 245.0 lb

## 2021-06-25 DIAGNOSIS — Z3481 Encounter for supervision of other normal pregnancy, first trimester: Secondary | ICD-10-CM | POA: Diagnosis not present

## 2021-06-25 DIAGNOSIS — Z3A09 9 weeks gestation of pregnancy: Secondary | ICD-10-CM | POA: Diagnosis not present

## 2021-06-25 DIAGNOSIS — Z8759 Personal history of other complications of pregnancy, childbirth and the puerperium: Secondary | ICD-10-CM | POA: Diagnosis not present

## 2021-06-25 DIAGNOSIS — Z3480 Encounter for supervision of other normal pregnancy, unspecified trimester: Secondary | ICD-10-CM | POA: Insufficient documentation

## 2021-06-25 MED ORDER — ASPIRIN 81 MG PO CHEW: 81.0000 mg | CHEWABLE_TABLET | Freq: Every day | ORAL | 1 refills | Status: DC

## 2021-06-25 NOTE — Progress Notes (Signed)
Bedside U/S shows single IUP with FHT of 178 BPM and CRL measures29.66mm  GA [redacted]w[redacted]d

## 2021-06-25 NOTE — Progress Notes (Signed)
Last pap 03/30/21- negative 

## 2021-06-25 NOTE — Progress Notes (Signed)
Subchorionic hemorrhage noted on U/S from last week

## 2021-06-25 NOTE — Progress Notes (Signed)
History:   Rita Stokes is a 36 y.o. G2P1001 at 65w3dby LMP being seen today for her first obstetrical visit.  Her obstetrical history is significant for advanced maternal age, obesity, pregnancy induced hypertension, and post partum hypertension with last pregnancy , postpartum hemorrhage.  Patient does intend to breast feed. Pregnancy history fully reviewed.  Patient reports no complaints.      HISTORY: OB History  Gravida Para Term Preterm AB Living  2 1 1  0 0 1  SAB IAB Ectopic Multiple Live Births  0 0 0 0 1    # Outcome Date GA Lbr Len/2nd Weight Sex Delivery Anes PTL Lv  2 Current           1 Term 05/08/19 [id number]:32 / 02:45 9 lb 3.2 oz (4.173 kg) F Vag-Spont EPI, Local  LIV     Name: Cooter,GIRL Myeasha     Apgar1: 8  Apgar5: 9    Last pap smear collected today.  Past Medical History:  Diagnosis Date   Anxiety    Medical history non-contributory    [place]   Past Surgical History:  Procedure Laterality Date   CYSTECTOMY     Family History  Problem Relation Age of Onset   Arthritis Mother    Arthritis/Rheumatoid Mother    Macular degeneration Mother    Social History   Tobacco Use   Smoking status: Never   Smokeless tobacco: Never  Vaping Use   Vaping Use: Never used  Substance Use Topics   Alcohol use: Never   Drug use: Never   No Known Allergies Current Outpatient Medications on File Prior to Visit  Medication Sig Dispense Refill   calcium carbonate (TUMS - DOSED IN MG ELEMENTAL CALCIUM) 500 MG chewable tablet Chew 1 tablet by mouth daily.     cetirizine (ZYRTEC) 10 MG tablet Take 10 mg by mouth daily.     Prenat w/o A Vit-FeFum-FePo-FA (FOLIVANE-OB) 85-1 MG CAPS Take 1 capsule by mouth daily.     sertraline (ZOLOFT) 25 MG tablet Take 25 mg by mouth daily.     VITAMIN D PO Take by mouth.     No current facility-administered medications on file prior to visit.    Review of Systems Pertinent items noted in HPI and remainder of  comprehensive ROS otherwise negative.  Physical Exam:   Vitals:   06/25/21 0918  BP: 118/69  Pulse: 75  Weight: 245 lb (111.1 kg)   Fetal Heart Rate (bpm): 178  Uterine size:    Patient informed that the ultrasound is considered a limited obstetric ultrasound and is not intended to be a complete ultrasound exam.  Patient also informed that the ultrasound is not being completed with the intent of assessing for fetal or placental anomalies or any pelvic abnormalities.  Explained that the purpose of todays ultrasound is to assess for fetal heart rate.  Patient acknowledges the purpose of the exam and the limitations of the study. General: well-developed, well-nourished female in no acute distress  Breasts:  normal appearance, no masses or tenderness bilaterally, exam done in the presence of a chaperone.   Skin: normal coloration and turgor, no rashes  Neurologic: oriented, normal, negative, normal mood  Extremities: normal strength, tone, and muscle mass, ROM of all joints is normal  HEENT PERRLA, extraocular movement intact and sclera clear, anicteric  Neck supple and no masses  Cardiovascular: regular rate and rhythm  Respiratory:  no respiratory distress, normal breath sounds  Abdomen: soft, non-tender; bowel sounds normal; no masses,  no organomegaly  Pelvic: normal external genitalia, no lesions, normal vaginal mucosa, normal vaginal discharge, normal cervix, pap smear done. Exam done in the presence of a chaperone.     Assessment:    Pregnancy: G2P1001 Patient Active Problem List   Diagnosis Date Noted   Supervision of other normal pregnancy, antepartum 06/25/2021   Morbid obesity (HCC)    SVD (spontaneous vaginal delivery) 05/10/2019   Gestational hypertension 05/10/2019   PROM (premature rupture of membranes) 05/07/2019   LGA (large for gestational age) fetus affecting management of mother 05/07/2019   Rubella non-immune status, antepartum 05/07/2019   Childhood asthma  05/07/2019     Plan:    1. Supervision of other normal pregnancy, antepartum  - Obstetric panel - HIV antibody (with reflex) - Hepatitis C Antibody - [place] bedside; Future - Babyscripts Schedule Optimization - Culture, OB Urine - GC/Chlamydia probe amp ([place])not at [facility] - [place] MFM OB DETAIL +14 WK; Future   2.  History of gestational hypertension- Postpartum   - Start BASA @ 12 weeks, RX sent.  - Protein / creatinine ratio, urine - Comp Met (CMET)    Initial labs drawn. Continue prenatal vitamins. Problem list reviewed and updated. Genetic Screening discussed, NIPS: requested. Ultrasound discussed; fetal anatomic survey: requested. Anticipatory guidance about prenatal visits given including labs, ultrasounds, and testing. Discussed usage of Babyscripts and virtual visits as additional source of managing and completing prenatal visits in midst of coronavirus and pandemic.   Encouraged to complete MyChart Registration for her ability to review results, send requests, and have questions addressed.  The nature of [place] for [facility]ulty Practice with multiple MDs and Advanced Practice Providers was explained to patient; also emphasized that residents, students are part of our team. Routine obstetric precautions reviewed. Encouraged to seek out care at office or emergency room [facility] MAU preferred) for urgent and/or emergent concerns. No follow-ups on file.     Vollie Aaron, Artist Pais, [place] for [employer], [place]

## 2021-06-27 LAB — CULTURE, OB URINE

## 2021-06-27 LAB — URINE CULTURE, OB REFLEX: Organism ID, Bacteria: NO GROWTH

## 2021-06-28 LAB — GC/CHLAMYDIA PROBE AMP (~~LOC~~) NOT AT ARMC
Chlamydia: NEGATIVE
Comment: NEGATIVE
Comment: NORMAL
Neisseria Gonorrhea: NEGATIVE

## 2021-06-29 LAB — HEPATITIS C ANTIBODY
Hepatitis C Ab: NONREACTIVE
SIGNAL TO CUT-OFF: <0.02 (ref <1.00)

## 2021-06-29 LAB — OBSTETRIC PANEL
Absolute Monocytes: 460 cells/uL (ref 200–950)
Antibody Screen: NOT DETECTED
Basophils Absolute: 64 cells/uL (ref 0–200)
Basophils Relative: 0.7 %
Eosinophils Absolute: 64 cells/uL (ref 15–500)
Eosinophils Relative: 0.7 %
HCT: 40 % (ref 35.0–45.0)
Hemoglobin: 13.2 g/dL (ref 11.7–15.5)
Hepatitis B Surface Ag: NONREACTIVE
Lymphs Abs: 2199 cells/uL (ref 850–3900)
MCH: 28.7 pg (ref 27.0–33.0)
MCHC: 33 g/dL (ref 32.0–36.0)
MCV: 87 fL (ref 80.0–100.0)
MPV: 11.4 fL (ref 7.5–12.5)
Monocytes Relative: 5 %
Neutro Abs: 6412 cells/uL (ref 1500–7800)
Neutrophils Relative %: 69.7 %
Platelets: 346 10*3/uL (ref 140–400)
RBC: 4.6 10*6/uL (ref 3.80–5.10)
RDW: 12.5 % (ref 11.0–15.0)
RPR Ser Ql: NONREACTIVE
Rubella: <0.9 Index — ABNORMAL LOW
Total Lymphocyte: 23.9 %
WBC: 9.2 10*3/uL (ref 3.8–10.8)

## 2021-06-29 LAB — HIV ANTIBODY (ROUTINE TESTING W REFLEX): HIV 1&2 Ab, 4th Generation: NONREACTIVE

## 2021-06-30 ENCOUNTER — Encounter: Payer: Self-pay | Admitting: Obstetrics and Gynecology

## 2021-06-30 DIAGNOSIS — E669 Obesity, unspecified: Secondary | ICD-10-CM | POA: Insufficient documentation

## 2021-06-30 DIAGNOSIS — Z8759 Personal history of other complications of pregnancy, childbirth and the puerperium: Secondary | ICD-10-CM | POA: Insufficient documentation

## 2021-07-13 ENCOUNTER — Other Ambulatory Visit: Payer: Self-pay

## 2021-07-13 ENCOUNTER — Ambulatory Visit (INDEPENDENT_AMBULATORY_CARE_PROVIDER_SITE_OTHER): Payer: 59 | Admitting: *Deleted

## 2021-07-13 DIAGNOSIS — Z36 Encounter for antenatal screening for chromosomal anomalies: Secondary | ICD-10-CM

## 2021-07-13 DIAGNOSIS — Z3A12 12 weeks gestation of pregnancy: Secondary | ICD-10-CM

## 2021-07-13 DIAGNOSIS — Z3480 Encounter for supervision of other normal pregnancy, unspecified trimester: Secondary | ICD-10-CM

## 2021-07-13 NOTE — Progress Notes (Signed)
Pt here for NIPTS only blood draw. 

## 2021-07-19 ENCOUNTER — Other Ambulatory Visit: Payer: Self-pay | Admitting: Obstetrics and Gynecology

## 2021-07-19 MED ORDER — ONDANSETRON HCL 8 MG PO TABS: 8.0000 mg | ORAL_TABLET | Freq: Three times a day (TID) | ORAL | 0 refills | Status: DC | PRN

## 2021-07-23 ENCOUNTER — Ambulatory Visit (INDEPENDENT_AMBULATORY_CARE_PROVIDER_SITE_OTHER): Payer: 59 | Admitting: Obstetrics and Gynecology

## 2021-07-23 ENCOUNTER — Other Ambulatory Visit: Payer: Self-pay

## 2021-07-23 VITALS — BP 115/76 | HR 73 | Wt 248.0 lb

## 2021-07-23 DIAGNOSIS — Z8759 Personal history of other complications of pregnancy, childbirth and the puerperium: Secondary | ICD-10-CM

## 2021-07-23 DIAGNOSIS — Z3480 Encounter for supervision of other normal pregnancy, unspecified trimester: Secondary | ICD-10-CM

## 2021-07-23 NOTE — Progress Notes (Signed)
Pt c/o constipation- stool softener is helping  ?

## 2021-07-23 NOTE — Progress Notes (Signed)
? ?  PRENATAL VISIT NOTE ? ?Subjective:  ?Rita Stokes is a 36 y.o. G2P1001 at [redacted]w[redacted]d being seen today for ongoing prenatal care.  She is currently monitored for the following issues for this high-risk pregnancy and has Rubella non-immune status, antepartum; Childhood asthma; Supervision of other normal pregnancy, antepartum; History of gestational hypertension; and Obesity (BMI 30-39.9) on their problem list. ? ?Patient reports no complaints.  Contractions: Not present. Vag. Bleeding: None.  Movement: Absent. Denies leaking of fluid.  ? ?The following portions of the patient's history were reviewed and updated as appropriate: allergies, current medications, past family history, past medical history, past social history, past surgical history and problem list.  ? ?Objective:  ? ?Vitals:  ? 07/23/21 0851  ?BP: 115/76  ?Pulse: 73  ?Weight: 248 lb (112.5 kg)  ? ? ?Fetal Status: Fetal Heart Rate (bpm): 154   Movement: Absent    ? ?General:  Alert, oriented and cooperative. Patient is in no acute distress.  ?Skin: Skin is warm and dry. No rash noted.   ?Cardiovascular: Normal heart rate noted  ?Respiratory: Normal respiratory effort, no problems with respiration noted  ?Abdomen: Soft, gravid, appropriate for gestational age.  Pain/Pressure: Absent     ?Pelvic: Cervical exam deferred        ?Extremities: Normal range of motion.  Edema: None  ?Mental Status: Normal mood and affect. Normal behavior. Normal judgment and thought content.  ? ?Assessment and Plan:  ?Pregnancy: G2P1001 at [redacted]w[redacted]d ? ?1. Supervision of other normal pregnancy, antepartum ? ?- Comprehensive metabolic panel ?- Protein / creatinine ratio, urine ?- Zofran is helping her nausea! She is now experiencing constipation. She is taking colace which is helping. She can use OTC miralax or metamucil.  ?- MFM [place] scheduled ? ?2. History of gestational hypertension ? ?- Labs not drawn last visit as ordered, will get today ?- Continue Basa ?- MFM referral  ?-  Comprehensive metabolic panel ?- Protein / creatinine ratio, urine  ? ? ?Preterm labor symptoms and general obstetric precautions including but not limited to vaginal bleeding, contractions, leaking of fluid and fetal movement were reviewed in detail with the patient. ?Please refer to After Visit Summary for other counseling recommendations.  ? ?Return in about 4 weeks (around 08/20/2021). ? ?Future Appointments  ?Date Time Provider Department Center  ?08/20/2021  8:10 AM Donette Larry, CNM [facility]  ?09/02/2021  9:30 AM [facility] NURSE [facility]  ?09/02/2021  9:45 AM [facility] US4 [facility]  ? ? ?Venia Carbon, NP  ?

## 2021-07-24 LAB — PROTEIN / CREATININE RATIO, URINE
Creatinine, Urine: 87 mg/dL (ref 20–275)
Protein/Creat Ratio: 103 mg/g creat (ref 24–184)
Protein/Creatinine Ratio: 0.103 mg/mg creat (ref 0.024–0.184)
Total Protein, Urine: 9 mg/dL (ref 5–24)

## 2021-07-24 LAB — COMPREHENSIVE METABOLIC PANEL
AG Ratio: 1.5 (calc) (ref 1.0–2.5)
ALT: 10 U/L (ref 6–29)
AST: 10 U/L (ref 10–30)
Albumin: 3.8 g/dL (ref 3.6–5.1)
Alkaline phosphatase (APISO): 48 U/L (ref 31–125)
BUN: 7 mg/dL (ref 7–25)
CO2: 26 mmol/L (ref 20–32)
Calcium: 9.2 mg/dL (ref 8.6–10.2)
Chloride: 104 mmol/L (ref 98–110)
Creat: 0.51 mg/dL (ref 0.50–0.97)
Globulin: 2.6 g/dL (calc) (ref 1.9–3.7)
Glucose, Bld: 79 mg/dL (ref 65–139)
Potassium: 4.2 mmol/L (ref 3.5–5.3)
Sodium: 137 mmol/L (ref 135–146)
Total Bilirubin: 0.3 mg/dL (ref 0.2–1.2)
Total Protein: 6.4 g/dL (ref 6.1–8.1)

## 2021-08-04 ENCOUNTER — Other Ambulatory Visit: Payer: Self-pay | Admitting: Obstetrics and Gynecology

## 2021-08-04 MED ORDER — ONDANSETRON HCL 8 MG PO TABS: 8.0000 mg | ORAL_TABLET | Freq: Three times a day (TID) | ORAL | 2 refills | Status: DC | PRN

## 2021-08-20 ENCOUNTER — Ambulatory Visit (INDEPENDENT_AMBULATORY_CARE_PROVIDER_SITE_OTHER): Payer: BC Managed Care – PPO | Admitting: Certified Nurse Midwife

## 2021-08-20 VITALS — BP 108/74 | HR 82 | Wt 248.0 lb

## 2021-08-20 DIAGNOSIS — F419 Anxiety disorder, unspecified: Secondary | ICD-10-CM | POA: Insufficient documentation

## 2021-08-20 DIAGNOSIS — Z8759 Personal history of other complications of pregnancy, childbirth and the puerperium: Secondary | ICD-10-CM

## 2021-08-20 DIAGNOSIS — Z3480 Encounter for supervision of other normal pregnancy, unspecified trimester: Secondary | ICD-10-CM

## 2021-08-20 DIAGNOSIS — O99340 Other mental disorders complicating pregnancy, unspecified trimester: Secondary | ICD-10-CM

## 2021-08-20 DIAGNOSIS — Z3A17 17 weeks gestation of pregnancy: Secondary | ICD-10-CM

## 2021-08-20 NOTE — Progress Notes (Signed)
Subjective:  ?Rita Stokes is a 36 y.o. G2P1001 at [redacted]w[redacted]d being seen today for ongoing prenatal care.  She is currently monitored for the following issues for this low-risk pregnancy and has Rubella non-immune status, antepartum; Childhood asthma; Supervision of other normal pregnancy, antepartum; History of gestational hypertension; Obesity (BMI 30-39.9); and Anxiety during pregnancy on their problem list. ? ?Patient reports no complaints.  Contractions: Not present. Vag. Bleeding: None.  Movement: Absent. Denies leaking of fluid.  ? ?The following portions of the patient's history were reviewed and updated as appropriate: allergies, current medications, past family history, past medical history, past social history, past surgical history and problem list. Problem list updated. ? ?Objective:  ? ?Vitals:  ? 08/20/21 0809  ?BP: 108/74  ?Pulse: 82  ?Weight: 248 lb (112.5 kg)  ? ? ?Fetal Status: Fetal Heart Rate (bpm): 153 Fundal Height: 17 cm Movement: Absent    ? ?General:  Alert, oriented and cooperative. Patient is in no acute distress.  ?Skin: Skin is warm and dry. No rash noted.   ?Cardiovascular: Normal heart rate noted  ?Respiratory: Normal respiratory effort, no problems with respiration noted  ?Abdomen: Soft, gravid, appropriate for gestational age. Pain/Pressure: Absent     ?Pelvic: Vag. Bleeding: None Vag D/C Character: Thin   ?Cervical exam deferred        ?Extremities: Normal range of motion.  Edema: None  ?Mental Status: Normal mood and affect. Normal behavior. Normal judgment and thought content.  ? ?Urinalysis:     ? ?Assessment and Plan:  ?Pregnancy: G2P1001 at [redacted]w[redacted]d ? ?1. History of gestational hypertension ?-bASA ? ?2. Supervision of other normal pregnancy, antepartum ?- Alpha fetoprotein, maternal ? ?3. [redacted] weeks gestation of pregnancy ? ? ?4. Anxiety during pregnancy ?-on Zoloft, followed by Psych ?-reports improvement since quit job ? ?Preterm labor symptoms and general obstetric precautions  including but not limited to vaginal bleeding, contractions, leaking of fluid and fetal movement were reviewed in detail with the patient. ?Please refer to After Visit Summary for other counseling recommendations.  ?Return in about 5 weeks (around 09/24/2021). ? ? ?Donette Larry, CNM ? ?

## 2021-08-23 LAB — ALPHA FETOPROTEIN, MATERNAL
AFP MoM: 1.39
AFP, Serum: 42 ng/mL
Calc'd Gestational Age: 17.4 weeks
Maternal Wt: 248 [lb_av]
Risk for ONTD: 1
Twins-AFP: 1

## 2021-09-02 ENCOUNTER — Other Ambulatory Visit: Payer: Self-pay | Admitting: *Deleted

## 2021-09-02 ENCOUNTER — Encounter: Payer: Self-pay | Admitting: *Deleted

## 2021-09-02 ENCOUNTER — Ambulatory Visit: Payer: 59 | Admitting: *Deleted

## 2021-09-02 ENCOUNTER — Ambulatory Visit: Payer: 59 | Attending: Obstetrics and Gynecology

## 2021-09-02 VITALS — BP 138/73 | HR 85

## 2021-09-02 DIAGNOSIS — O99340 Other mental disorders complicating pregnancy, unspecified trimester: Secondary | ICD-10-CM | POA: Insufficient documentation

## 2021-09-02 DIAGNOSIS — O09522 Supervision of elderly multigravida, second trimester: Secondary | ICD-10-CM | POA: Insufficient documentation

## 2021-09-02 DIAGNOSIS — F419 Anxiety disorder, unspecified: Secondary | ICD-10-CM | POA: Insufficient documentation

## 2021-09-02 DIAGNOSIS — Z8759 Personal history of other complications of pregnancy, childbirth and the puerperium: Secondary | ICD-10-CM

## 2021-09-02 DIAGNOSIS — O99212 Obesity complicating pregnancy, second trimester: Secondary | ICD-10-CM | POA: Insufficient documentation

## 2021-09-02 DIAGNOSIS — O09899 Supervision of other high risk pregnancies, unspecified trimester: Secondary | ICD-10-CM

## 2021-09-02 DIAGNOSIS — Z3480 Encounter for supervision of other normal pregnancy, unspecified trimester: Secondary | ICD-10-CM | POA: Insufficient documentation

## 2021-09-02 DIAGNOSIS — Z3A19 19 weeks gestation of pregnancy: Secondary | ICD-10-CM | POA: Insufficient documentation

## 2021-09-02 DIAGNOSIS — E669 Obesity, unspecified: Secondary | ICD-10-CM | POA: Insufficient documentation

## 2021-09-02 DIAGNOSIS — O09292 Supervision of pregnancy with other poor reproductive or obstetric history, second trimester: Secondary | ICD-10-CM | POA: Insufficient documentation

## 2021-09-02 DIAGNOSIS — Z363 Encounter for antenatal screening for malformations: Secondary | ICD-10-CM | POA: Insufficient documentation

## 2021-09-02 DIAGNOSIS — R638 Other symptoms and signs concerning food and fluid intake: Secondary | ICD-10-CM

## 2021-09-02 DIAGNOSIS — Z2839 Other underimmunization status: Secondary | ICD-10-CM | POA: Insufficient documentation

## 2021-09-14 ENCOUNTER — Ambulatory Visit
Admission: RE | Admit: 2021-09-14 | Discharge: 2021-09-14 | Disposition: A | Discharge status: Home / Self Care | Payer: BC Managed Care – PPO | Source: Ambulatory Visit

## 2021-09-14 VITALS — BP 114/75 | HR 84 | Temp 98.8°F | Resp 20

## 2021-09-14 DIAGNOSIS — R0982 Postnasal drip: Secondary | ICD-10-CM

## 2021-09-14 DIAGNOSIS — J302 Other seasonal allergic rhinitis: Secondary | ICD-10-CM | POA: Diagnosis not present

## 2021-09-14 MED ORDER — IPRATROPIUM BROMIDE 0.06 % NA SOLN: 2.0000 | Freq: Three times a day (TID) | NASAL | 1 refills | Status: DC

## 2021-09-14 MED ORDER — FLUTICASONE PROPIONATE 50 MCG/ACT NA SUSP: 1.0000 | Freq: Every day | NASAL | 1 refills | Status: DC

## 2021-09-14 NOTE — ED Triage Notes (Signed)
Pt c/o cough and nasal drainage for about 2 weeks. She is [redacted] weeks pregnant.  ?Home interventions: zyrtec ?

## 2021-09-14 NOTE — Discharge Instructions (Signed)
Please continue taking cetirizine as you are.  I have added prescriptions for Flonase and Atrovent to your regimen. ? ?Zyrtec (cetirizine): This is an excellent second-generation antihistamine that helps to reduce respiratory inflammatory response to environmental allergens.  In some patients, this medication can cause daytime sleepiness so I recommend that you take 1 tablet daily at bedtime.   ? ?Flonase (fluticasone): This is a steroid nasal spray that you use once daily, 1 spray in each nare.  This medication does not work well if you decide to use it only used as you feel you need to, it works best used on a daily basis.  After 3 to 5 days of use, you will notice significant reduction of the inflammation and mucus production that is currently being caused by exposure to allergens, whether seasonal or environmental.  The most common side effect of this medication is nosebleeds.  If you experience a nosebleed, please discontinue use for 1 week, then feel free to resume.  I have provided you with a prescription.   ? ?Atrovent (ipratropium): This is an excellent nasal decongestant spray I have added to your recommended nasal steroid that will not cause rebound congestion, please instill 2 sprays into each nare with each use.  Because nasal steroids can take several days before they begin to provide full benefit, I recommend that you use this spray in addition to the nasal steroid prescribed for you.  Please use it after you have used your nasal steroid and repeat up to 4 times daily as needed.  I have provided you with a prescription for this medication.    ? ?If you find that you have not had significant relief of your symptoms in the next 7 to 10 days, please follow-up with your primary care provider or return here to urgent care for repeat evaluation and further recommendations. ?  ?Thank you for visiting urgent care today.  We appreciate the opportunity to participate in your care. ? ?

## 2021-09-14 NOTE — ED Provider Notes (Addendum)
?[facility] ? ? ? ?CSN: [id number] ?Arrival date & time: 09/14/21  1733 ?  ? ?HISTORY  ? ?Chief Complaint  ?Patient presents with  ? Cough  ?  I've had a cough for about 2 weeks and 4 days now. I believe it to be allergies as I also have drainage and have seasonal allergies, but it will not go away. - Entered by patient  ? ?HPI ?Rita Stokes is a 36 y.o. female. Patient complains of having a nonproductive cough cough for 2 weeks and 4 days, cough is worse at night.  Patient states she thinks she has allergies because she also has postnasal drip and history of seasonal allergies but despite taking cetirizine has not been able to get her symptoms are resolved.  Patient denies fever, aches, chills, nausea, vomiting, diarrhea, headache, body ache.  Patient states she is [redacted] weeks pregnant ? ?The history is provided by the patient.  ?Past Medical History:  ?Diagnosis Date  ? Anxiety   ? Medical history non-contributory   ? Morbid obesity (HCC)   ? ?Patient Active Problem List  ? Diagnosis Date Noted  ? Anxiety during pregnancy 08/20/2021  ? History of gestational hypertension 06/30/2021  ? Obesity (BMI 30-39.9) 06/30/2021  ? Supervision of other normal pregnancy, antepartum 06/25/2021  ? Rubella non-immune status, antepartum 05/07/2019  ? Childhood asthma 05/07/2019  ? ?Past Surgical History:  ?Procedure Laterality Date  ? CYSTECTOMY    ? ?OB History   ? ? Gravida  ?2  ? Para  ?1  ? Term  ?1  ? Preterm  ?   ? AB  ?   ? Living  ?1  ?  ? ? SAB  ?   ? IAB  ?   ? Ectopic  ?   ? Multiple  ?0  ? Live Births  ?1  ?   ?  ?  ? ?Home Medications   ? ?Prior to Admission medications   ?Medication Sig Start Date End Date Taking? Authorizing Provider  ?aspirin 81 MG chewable tablet Chew 1 tablet (81 mg total) by mouth daily. 06/25/21   Rasch, Victorino Dike I, NP  ?calcium carbonate (TUMS - DOSED IN MG ELEMENTAL CALCIUM) 500 MG chewable tablet Chew 1 tablet by mouth daily.    [provider]  ?cetirizine (ZYRTEC) 10 MG  tablet Take 10 mg by mouth daily.    [provider]  ?sertraline (ZOLOFT) 25 MG tablet Take 25 mg by mouth daily. 06/11/21   [provider]  ? ?Family History ?Family History  ?Problem Relation Age of Onset  ? Arthritis Mother   ? Arthritis/Rheumatoid Mother   ? Macular degeneration Mother   ? ?Social History ?Social History  ? ?Tobacco Use  ? Smoking status: Never  ? Smokeless tobacco: Never  ?Vaping Use  ? Vaping Use: Never used  ?Substance Use Topics  ? Alcohol use: Never  ? Drug use: Never  ? ?Allergies   ?Patient has no known allergies. ? ?Review of Systems ?Review of Systems ?Pertinent findings noted in history of present illness.  ? ?Physical Exam ?Triage Vital Signs ?ED Triage Vitals  ?Enc Vitals Group  ?   BP 02/26/21 0827 (!) 147/82  ?   Pulse Rate 02/26/21 0827 72  ?   Resp 02/26/21 0827 18  ?   Temp 02/26/21 0827 98.3 ?F (36.8 ?C)  ?   Temp Source 02/26/21 0827 Oral  ?   SpO2 02/26/21 0827 98 %  ?  Weight --   ?   Height --   ?   Head Circumference --   ?   Peak Flow --   ?   Pain Score 02/26/21 0826 5  ?   Pain Loc --   ?   Pain Edu? --   ?   Excl. in GC? --   ?No data found. ? ?Updated Vital Signs ?BP 114/75 (BP Location: Right Arm)   Pulse 84   Temp 98.8 ?F (37.1 ?C) (Oral)   Resp 20   LMP 04/20/2021   SpO2 98%  ? ?Physical Exam ?Vitals and nursing note reviewed.  ?Constitutional:   ?   General: She is not in acute distress. ?   Appearance: Normal appearance. She is not ill-appearing.  ?HENT:  ?   Head: Normocephalic and atraumatic.  ?   Salivary Glands: Right salivary gland is not diffusely enlarged or tender. Left salivary gland is not diffusely enlarged or tender.  ?   Right Ear: Ear canal and external ear normal. No drainage. A middle ear effusion is present. There is no impacted cerumen. Tympanic membrane is bulging. Tympanic membrane is not injected or erythematous.  ?   Left Ear: Ear canal and external ear normal. No drainage. A middle ear effusion is present. There is  no impacted cerumen. Tympanic membrane is bulging. Tympanic membrane is not injected or erythematous.  ?   Ears:  ?   Comments: Bilateral EACs normal, both TMs bulging with clear fluid ?   Nose: Rhinorrhea present. No nasal deformity, septal deviation, signs of injury, nasal tenderness, mucosal edema or congestion. Rhinorrhea is clear.  ?   Right Nostril: Occlusion present. No foreign body, epistaxis or septal hematoma.  ?   Left Nostril: Occlusion present. No foreign body, epistaxis or septal hematoma.  ?   Right Turbinates: Enlarged, swollen and pale.  ?   Left Turbinates: Enlarged, swollen and pale.  ?   Right Sinus: No maxillary sinus tenderness or frontal sinus tenderness.  ?   Left Sinus: No maxillary sinus tenderness or frontal sinus tenderness.  ?   Mouth/Throat:  ?   Lips: Pink. No lesions.  ?   Mouth: Mucous membranes are moist. No oral lesions.  ?   Pharynx: Oropharynx is clear. Uvula midline. No posterior oropharyngeal erythema or uvula swelling.  ?   Tonsils: No tonsillar exudate. 0 on the right. 0 on the left.  ?   Comments: Postnasal drip ?Eyes:  ?   General: Lids are normal.     ?   Right eye: No discharge.     ?   Left eye: No discharge.  ?   Extraocular Movements: Extraocular movements intact.  ?   Conjunctiva/sclera: Conjunctivae normal.  ?   Right eye: Right conjunctiva is not injected.  ?   Left eye: Left conjunctiva is not injected.  ?Neck:  ?   Trachea: Trachea and phonation normal.  ?Cardiovascular:  ?   Rate and Rhythm: Normal rate and regular rhythm.  ?   Pulses: Normal pulses.  ?   Heart sounds: Normal heart sounds. No murmur heard. ?  No friction rub. No gallop.  ?Pulmonary:  ?   Effort: Pulmonary effort is normal. No accessory muscle usage, prolonged expiration or respiratory distress.  ?   Breath sounds: Normal breath sounds. No stridor, decreased air movement or transmitted upper airway sounds. No decreased breath sounds, wheezing, rhonchi or rales.  ?Chest:  ?   Chest wall: No  tenderness.  ?Musculoskeletal:     ?   General: Normal range of motion.  ?   Cervical back: Normal range of motion and neck supple. Normal range of motion.  ?Lymphadenopathy:  ?   Cervical: No cervical adenopathy.  ?Skin: ?   General: Skin is warm and dry.  ?   Findings: No erythema or rash.  ?Neurological:  ?   General: No focal deficit present.  ?   Mental Status: She is alert and oriented to person, place, and time.  ?Psychiatric:     ?   Mood and Affect: Mood normal.     ?   Behavior: Behavior normal.  ? ? ?Visual Acuity ?Right Eye Distance:   ?Left Eye Distance:   ?Bilateral Distance:   ? ?Right Eye Near:   ?Left Eye Near:    ?Bilateral Near:    ? ?UC Couse / Diagnostics / Procedures:  ?  ?EKG ? ?Radiology ?No results found. ? ?Procedures ?Procedures (including critical care time) ? ?UC Diagnoses / Final Clinical Impressions(s)   ?I have reviewed the triage vital signs and the nursing notes. ? ?Pertinent labs & imaging results that were available during my care of the patient were reviewed by me and considered in my medical decision making (see chart for details).   ?Final diagnoses:  ?Seasonal allergic rhinitis, unspecified trigger  ?Postnasal drip  ? ?Patient was advised to continue cetirizine and add Flovent and Atrovent to her allergy regimen.  Patient advised that dextromethorphan is not safe to take during pregnancy. ? ?ED Prescriptions   ? ? Medication Sig Dispense Auth. Provider  ? ipratropium (ATROVENT) 0.06 % nasal spray Place 2 sprays into both nostrils 3 (three) times daily. As needed for nasal congestion, runny nose 15 mL Theadora RamaMorgan, Ezmeralda Stefanick Scales, PA-C  ? fluticasone (FLONASE) 50 MCG/ACT nasal spray Place 1 spray into both nostrils daily. 48 mL Theadora RamaMorgan, Donnovan Stamour Scales, PA-C  ? ?  ? ?PDMP not reviewed this encounter. ? ?Pending results:  ?Labs Reviewed - No data to display ? ?Medications Ordered in UC: ?Medications - No data to display ? ?Disposition Upon Discharge:  ?Condition: stable for discharge  home ?Home: take medications as prescribed; routine discharge instructions as discussed; follow up as advised. ? ?Patient presented with an acute illness with associated systemic symptoms and significant discomfort

## 2021-09-24 ENCOUNTER — Ambulatory Visit (INDEPENDENT_AMBULATORY_CARE_PROVIDER_SITE_OTHER): Payer: BC Managed Care – PPO | Admitting: Obstetrics and Gynecology

## 2021-09-24 VITALS — BP 112/70 | HR 96 | Wt 259.0 lb

## 2021-09-24 DIAGNOSIS — Z3482 Encounter for supervision of other normal pregnancy, second trimester: Secondary | ICD-10-CM

## 2021-09-24 DIAGNOSIS — Z3480 Encounter for supervision of other normal pregnancy, unspecified trimester: Secondary | ICD-10-CM

## 2021-09-24 DIAGNOSIS — Z8759 Personal history of other complications of pregnancy, childbirth and the puerperium: Secondary | ICD-10-CM

## 2021-09-24 DIAGNOSIS — Z3A22 22 weeks gestation of pregnancy: Secondary | ICD-10-CM

## 2021-09-24 NOTE — Progress Notes (Signed)
   PRENATAL VISIT NOTE  Subjective:  Rita Stokes is a 36 y.o. G2P1001 at [redacted]w[redacted]d being seen today for ongoing prenatal care.  She is currently monitored for the following issues for this low-risk pregnancy and has Rubella non-immune status, antepartum; Childhood asthma; Supervision of other normal pregnancy, antepartum; History of gestational hypertension; Obesity (BMI 30-39.9); and Anxiety during pregnancy on their problem list.  Patient reports no complaints.  Contractions: Not present. Vag. Bleeding: None.  Movement: Present. Denies leaking of fluid.   The following portions of the patient's history were reviewed and updated as appropriate: allergies, current medications, past family history, past medical history, past social history, past surgical history and problem list.   Objective:   Vitals:   09/24/21 0803  BP: 112/70  Pulse: 96  Weight: 259 lb (117.5 kg)    Fetal Status: Fetal Heart Rate (bpm): 144 Fundal Height: 26 cm Movement: Present     General:  Alert, oriented and cooperative. Patient is in no acute distress.  Skin: Skin is warm and dry. No rash noted.   Cardiovascular: Normal heart rate noted  Respiratory: Normal respiratory effort, no problems with respiration noted  Abdomen: Soft, gravid, appropriate for gestational age.  Pain/Pressure: Absent     Pelvic: Cervical exam deferred        Extremities: Normal range of motion.  Edema: None  Mental Status: Normal mood and affect. Normal behavior. Normal judgment and thought content.   Assessment and Plan:  Pregnancy: G2P1001 at [redacted]w[redacted]d  1. Supervision of other normal pregnancy, antepartum  - Fundal height 26 cm today. Hx of LGA baby with previous pregnancy. She has a follow up US with MFM for growth on 6/9, last US show baby at 97% - 2 hour GTT next visit, no history of GDM - Good fetal movement.    2. History of gestational hypertension  - Continue BASA - Baseline labs complete and reviewed - BP good  today.   Preterm labor symptoms and general obstetric precautions including but not limited to vaginal bleeding, contractions, leaking of fluid and fetal movement were reviewed in detail with the patient. Please refer to After Visit Summary for other counseling recommendations.   Return in about 4 weeks (around 10/22/2021), or for 2 hour GTT.Marland Kitchen  Future Appointments  Date Time Provider Department Center  10/08/2021  7:45 AM [facility] NURSE [facility]  10/08/2021  8:00 AM [facility] US1 [facility]  10/22/2021  8:10 AM Khiley Lieser, Harolyn Rutherford, NP [facility]    Venia Carbon, NP

## 2021-10-08 ENCOUNTER — Other Ambulatory Visit: Payer: Self-pay | Admitting: *Deleted

## 2021-10-08 ENCOUNTER — Ambulatory Visit: Payer: BC Managed Care – PPO | Admitting: *Deleted

## 2021-10-08 ENCOUNTER — Ambulatory Visit: Payer: BC Managed Care – PPO | Attending: Maternal & Fetal Medicine

## 2021-10-08 VITALS — BP 119/62 | HR 82

## 2021-10-08 DIAGNOSIS — O09293 Supervision of pregnancy with other poor reproductive or obstetric history, third trimester: Secondary | ICD-10-CM | POA: Diagnosis not present

## 2021-10-08 DIAGNOSIS — E669 Obesity, unspecified: Secondary | ICD-10-CM

## 2021-10-08 DIAGNOSIS — O09522 Supervision of elderly multigravida, second trimester: Secondary | ICD-10-CM | POA: Insufficient documentation

## 2021-10-08 DIAGNOSIS — R638 Other symptoms and signs concerning food and fluid intake: Secondary | ICD-10-CM | POA: Diagnosis present

## 2021-10-08 DIAGNOSIS — O99212 Obesity complicating pregnancy, second trimester: Secondary | ICD-10-CM

## 2021-10-08 DIAGNOSIS — Z3A25 25 weeks gestation of pregnancy: Secondary | ICD-10-CM

## 2021-10-22 ENCOUNTER — Ambulatory Visit (INDEPENDENT_AMBULATORY_CARE_PROVIDER_SITE_OTHER): Payer: BC Managed Care – PPO | Admitting: Obstetrics and Gynecology

## 2021-10-22 VITALS — BP 110/64 | HR 86 | Wt 268.0 lb

## 2021-10-22 DIAGNOSIS — Z23 Encounter for immunization: Secondary | ICD-10-CM | POA: Diagnosis not present

## 2021-10-22 DIAGNOSIS — Z3480 Encounter for supervision of other normal pregnancy, unspecified trimester: Secondary | ICD-10-CM

## 2021-10-22 DIAGNOSIS — Z3A27 27 weeks gestation of pregnancy: Secondary | ICD-10-CM

## 2021-10-22 NOTE — Progress Notes (Signed)
   PRENATAL VISIT NOTE  Subjective:  Rita Stokes is a 36 y.o. G2P1001 at [redacted]w[redacted]d being seen today for ongoing prenatal care.  She is currently monitored for the following issues for this low-risk pregnancy and has Rubella non-immune status, antepartum; Childhood asthma; Supervision of other normal pregnancy, antepartum; History of gestational hypertension; Obesity (BMI 30-39.9); and Anxiety during pregnancy on their problem list.  Patient reports no complaints.  Contractions: Not present. Vag. Bleeding: None.  Movement: Present. Denies leaking of fluid.   The following portions of the patient's history were reviewed and updated as appropriate: allergies, current medications, past family history, past medical history, past social history, past surgical history and problem list.   Objective:   Vitals:   10/22/21 0804  BP: 110/64  Pulse: 86  Weight: 268 lb (121.6 kg)    Fetal Status: Fetal Heart Rate (bpm): 153 Fundal Height: 28 cm Movement: Present     General:  Alert, oriented and cooperative. Patient is in no acute distress.  Skin: Skin is warm and dry. No rash noted.   Cardiovascular: Normal heart rate noted  Respiratory: Normal respiratory effort, no problems with respiration noted  Abdomen: Soft, gravid, appropriate for gestational age.  Pain/Pressure: Absent     Pelvic: Cervical exam deferred        Extremities: Normal range of motion.  Edema: None  Mental Status: Normal mood and affect. Normal behavior. Normal judgment and thought content.   Assessment and Plan:  Pregnancy: G2P1001 at [redacted]w[redacted]d 1. Supervision of other normal pregnancy, antepartum  - 2Hr GTT w/ 1 Hr Carpenter 75 g - HIV antibody (with reflex) - RPR - CBC -Growth [place] per MFM, patient asking if she can have the growth [place] farther apart from last [place] given the cost of MFM visits. We will continue to closely monitor her fundal height here in the office. If she were to have GDM or elevated BPs we would recommended  closer follow up with MFM. Message sent for growth Korea to be closer to 35 weeks.  - TDAP given today.   Preterm labor symptoms and general obstetric precautions including but not limited to vaginal bleeding, contractions, leaking of fluid and fetal movement were reviewed in detail with the patient. Please refer to After Visit Summary for other counseling recommendations.   No follow-ups on file.  Future Appointments  Date Time Provider Department Center  11/09/2021  3:30 PM Berle Mull [facility]  12/17/2021  7:45 AM [facility] NURSE [facility]  12/17/2021  8:00 AM [facility] US1 [facility]    Venia Carbon, NP

## 2021-10-25 LAB — CBC
HCT: 35.9 % (ref 35.0–45.0)
Hemoglobin: 11.8 g/dL (ref 11.7–15.5)
MCH: 28.8 pg (ref 27.0–33.0)
MCHC: 32.9 g/dL (ref 32.0–36.0)
MCV: 87.6 fL (ref 80.0–100.0)
MPV: 10.6 fL (ref 7.5–12.5)
Platelets: 254 10*3/uL (ref 140–400)
RBC: 4.1 10*6/uL (ref 3.80–5.10)
RDW: 13.3 % (ref 11.0–15.0)
WBC: 9.6 10*3/uL (ref 3.8–10.8)

## 2021-10-25 LAB — 2HR GTT W 1 HR, CARPENTER, 75 G
Glucose, 1 Hr, Gest: 143 mg/dL (ref 65–179)
Glucose, 2 Hr, Gest: 106 mg/dL (ref 65–152)
Glucose, Fasting, Gest: 79 mg/dL (ref 65–91)

## 2021-10-25 LAB — HIV ANTIBODY (ROUTINE TESTING W REFLEX): HIV 1&2 Ab, 4th Generation: NONREACTIVE

## 2021-10-25 LAB — RPR: RPR Ser Ql: NONREACTIVE

## 2021-11-09 ENCOUNTER — Ambulatory Visit (INDEPENDENT_AMBULATORY_CARE_PROVIDER_SITE_OTHER): Payer: BC Managed Care – PPO

## 2021-11-09 VITALS — BP 112/73 | HR 94 | Wt 275.0 lb

## 2021-11-09 DIAGNOSIS — Z3483 Encounter for supervision of other normal pregnancy, third trimester: Secondary | ICD-10-CM

## 2021-11-09 DIAGNOSIS — E669 Obesity, unspecified: Secondary | ICD-10-CM

## 2021-11-09 DIAGNOSIS — Z8759 Personal history of other complications of pregnancy, childbirth and the puerperium: Secondary | ICD-10-CM

## 2021-11-09 DIAGNOSIS — Z3A30 30 weeks gestation of pregnancy: Secondary | ICD-10-CM

## 2021-11-09 DIAGNOSIS — Z3480 Encounter for supervision of other normal pregnancy, unspecified trimester: Secondary | ICD-10-CM

## 2021-11-09 NOTE — Progress Notes (Signed)
   PRENATAL VISIT NOTE  Subjective:  Rita Stokes is a 36 y.o. G2P1001 at [redacted]w[redacted]d being seen today for ongoing prenatal care.  She is currently monitored for the following issues for this low-risk pregnancy and has Rubella non-immune status, antepartum; Childhood asthma; Supervision of other normal pregnancy, antepartum; History of gestational hypertension; Obesity (BMI 30-39.9); and Anxiety during pregnancy on their problem list.  Patient reports no complaints.  Contractions: Not present. Vag. Bleeding: None.  Movement: Present. Denies leaking of fluid.   The following portions of the patient's history were reviewed and updated as appropriate: allergies, current medications, past family history, past medical history, past social history, past surgical history and problem list.   Objective:   Vitals:   11/09/21 1534  BP: 112/73  Pulse: 94  Weight: 275 lb (124.7 kg)    Fetal Status: Fetal Heart Rate (bpm): 152 Fundal Height: 31 cm Movement: Present     General:  Alert, oriented and cooperative. Patient is in no acute distress.  Skin: Skin is warm and dry. No rash noted.   Cardiovascular: Normal heart rate noted  Respiratory: Normal respiratory effort, no problems with respiration noted  Abdomen: Soft, gravid, appropriate for gestational age.  Pain/Pressure: Absent     Pelvic: Cervical exam deferred        Extremities: Normal range of motion.  Edema: None  Mental Status: Normal mood and affect. Normal behavior. Normal judgment and thought content.   Assessment and Plan:  Pregnancy: G2P1001 at [redacted]w[redacted]d 1. Supervision of other normal pregnancy, antepartum - Routine OB. Doing well, no concerns - Anticipatory guidance for upcoming appointments reviewed  2. [redacted] weeks gestation of pregnancy - FH appropriate - Endorses active fetal movement  3. History of postpartum hemorrhage - Uterine atony, EBL >1500cc - Consider TXA at delivery  4. Obesity (BMI 30-39.9) - Continue bASA - Growth  ultrasound on 8/18   Preterm labor symptoms and general obstetric precautions including but not limited to vaginal bleeding, contractions, leaking of fluid and fetal movement were reviewed in detail with the patient. Please refer to After Visit Summary for other counseling recommendations.   Return in about 2 weeks (around 11/23/2021) for ROB.  Future Appointments  Date Time Provider Department Center  11/25/2021  4:10 PM Milas Hock, MD [facility]  12/17/2021  7:45 AM [facility] NURSE [facility]  12/17/2021  8:00 AM [facility] US1 [facility]    Brand Males, CNM

## 2021-11-23 DIAGNOSIS — O3660X0 Maternal care for excessive fetal growth, unspecified trimester, not applicable or unspecified: Secondary | ICD-10-CM | POA: Insufficient documentation

## 2021-11-23 DIAGNOSIS — E559 Vitamin D deficiency, unspecified: Secondary | ICD-10-CM | POA: Insufficient documentation

## 2021-11-23 DIAGNOSIS — E7800 Pure hypercholesterolemia, unspecified: Secondary | ICD-10-CM | POA: Insufficient documentation

## 2021-11-23 NOTE — Progress Notes (Unsigned)
PRENATAL VISIT NOTE  Subjective:  Rita Stokes is a 36 y.o. G2P1001 at 39w3dbeing seen today for ongoing prenatal care.  She is currently monitored for the following issues for this low-risk pregnancy and has Rubella non-immune status, antepartum; Childhood asthma; Supervision of other normal pregnancy, antepartum; History of gestational hypertension; Obesity (BMI 30-39.9); Anxiety during pregnancy; Pure hypercholesterolemia; and Large for gestational age fetus affecting management of mother on their problem list.  Patient reports no complaints.  Contractions: Not present. Vag. Bleeding: None.  Movement: Present. Denies leaking of fluid.   The following portions of the patient's history were reviewed and updated as appropriate: allergies, current medications, past family history, past medical history, past social history, past surgical history and problem list.   Objective:   Vitals:   11/25/21 1612  BP: 114/81  Pulse: 94  Weight: 276 lb (125.2 kg)    Fetal Status: Fetal Heart Rate (bpm): 167   Movement: Present     General:  Alert, oriented and cooperative. Patient is in no acute distress.  Skin: Skin is warm and dry. No rash noted.   Cardiovascular: Normal heart rate noted  Respiratory: Normal respiratory effort, no problems with respiration noted  Abdomen: Soft, gravid, appropriate for gestational age.  Pain/Pressure: Absent     Pelvic: Cervical exam deferred        Extremities: Normal range of motion.  Edema: None  Mental Status: Normal mood and affect. Normal behavior. Normal judgment and thought content.   Assessment and Plan:  Pregnancy: G2P1001 at 323w3d. Rubella non-immune status, antepartum Discussed MMR after delivery  2. Supervision of other normal pregnancy, antepartum 28w labs wnl Tdap given previously Rh positive  3. History of gestational hypertension Continue ldASA until delivery  4. Anxiety during pregnancy On Zoloft 25 daily - was more situational.  Has f/u with her provider in 2 weeks.   5. Excessive fetal growth affecting management of pregnancy in third trimester, single or unspecified fetus - She delivered a baby 9lb2oz with Dr. PiAlwyn Pea no shoulder dystocia; pushed about 2.5h and had atony after with lost IV - Methergine given with uterine massage and the pitocin and responded well to this. No transfusion needed.  - Continue to follow fetal growth with this pregnancy due to history. Next [place] 8/18. - We discussed the circumstances in which c-section would be offered for macrosomia in the absence of DM (5kg). We discussed the range of error for EFW and that it tends to overestimate in bigger babies. We discussed the risk of shoulder dystocia is overall low and that within that risk the risk of injury is about 1% and then 1% of that are permanent neurological injuries. Discussed that doing a c-section does not necessarily avoid the risk of shoulder dystocia and birth injury completely as there are case reports of nerve palsy in c-sections. We discussed the maneuvers for delivery of a shoulder dystocia. Reviewed induction prior to 39 weeks usually not indicated for macrosomia alone but other factors may warrant induction prior to that I.e. GHTN, polyhydramnios, etc. We will await [place] 8/18 and then see what our MFM recommend. She did previously go into labor at 37 weeks.  All questions answered for her and spouse, JaEdison Nasuti    Preterm labor symptoms and general obstetric precautions including but not limited to vaginal bleeding, contractions, leaking of fluid and fetal movement were reviewed in detail with the patient. Please refer to After Visit Summary for other counseling recommendations.  Return in about 2 weeks (around 12/09/2021) for OB VISIT, MD or APP.  Future Appointments  Date Time Provider [place]  12/10/2021  8:30 AM Julianne Handler, CNM [facility]  12/17/2021  7:45 AM [facility] NURSE [facility]  12/17/2021  8:00  AM [facility] US1 [facility]  12/23/2021  8:50 AM Constant, Vickii Chafe, MD [facility]  12/31/2021  8:30 AM Rasch, Artist Pais, NP [facility]    Radene Gunning, MD

## 2021-11-24 ENCOUNTER — Encounter: Payer: Self-pay | Admitting: Obstetrics & Gynecology

## 2021-11-25 ENCOUNTER — Ambulatory Visit (INDEPENDENT_AMBULATORY_CARE_PROVIDER_SITE_OTHER): Payer: BC Managed Care – PPO | Admitting: Obstetrics and Gynecology

## 2021-11-25 VITALS — BP 114/81 | HR 94 | Wt 276.0 lb

## 2021-11-25 DIAGNOSIS — F419 Anxiety disorder, unspecified: Secondary | ICD-10-CM

## 2021-11-25 DIAGNOSIS — Z2839 Other underimmunization status: Secondary | ICD-10-CM

## 2021-11-25 DIAGNOSIS — O99340 Other mental disorders complicating pregnancy, unspecified trimester: Secondary | ICD-10-CM

## 2021-11-25 DIAGNOSIS — Z8759 Personal history of other complications of pregnancy, childbirth and the puerperium: Secondary | ICD-10-CM

## 2021-11-25 DIAGNOSIS — O3663X0 Maternal care for excessive fetal growth, third trimester, not applicable or unspecified: Secondary | ICD-10-CM

## 2021-11-25 DIAGNOSIS — O09899 Supervision of other high risk pregnancies, unspecified trimester: Secondary | ICD-10-CM

## 2021-11-25 DIAGNOSIS — Z3480 Encounter for supervision of other normal pregnancy, unspecified trimester: Secondary | ICD-10-CM

## 2021-11-26 ENCOUNTER — Ambulatory Visit: Payer: BC Managed Care – PPO

## 2021-12-10 ENCOUNTER — Ambulatory Visit (INDEPENDENT_AMBULATORY_CARE_PROVIDER_SITE_OTHER): Payer: BC Managed Care – PPO | Admitting: Certified Nurse Midwife

## 2021-12-10 VITALS — BP 108/74 | HR 92 | Wt 279.0 lb

## 2021-12-10 DIAGNOSIS — O2600 Excessive weight gain in pregnancy, unspecified trimester: Secondary | ICD-10-CM | POA: Insufficient documentation

## 2021-12-10 DIAGNOSIS — O26893 Other specified pregnancy related conditions, third trimester: Secondary | ICD-10-CM

## 2021-12-10 DIAGNOSIS — O09899 Supervision of other high risk pregnancies, unspecified trimester: Secondary | ICD-10-CM

## 2021-12-10 DIAGNOSIS — O2603 Excessive weight gain in pregnancy, third trimester: Secondary | ICD-10-CM

## 2021-12-10 DIAGNOSIS — Z3A34 34 weeks gestation of pregnancy: Secondary | ICD-10-CM

## 2021-12-10 DIAGNOSIS — O99210 Obesity complicating pregnancy, unspecified trimester: Secondary | ICD-10-CM | POA: Insufficient documentation

## 2021-12-10 DIAGNOSIS — Z3480 Encounter for supervision of other normal pregnancy, unspecified trimester: Secondary | ICD-10-CM

## 2021-12-10 DIAGNOSIS — R12 Heartburn: Secondary | ICD-10-CM

## 2021-12-10 DIAGNOSIS — Z2839 Other underimmunization status: Secondary | ICD-10-CM

## 2021-12-10 DIAGNOSIS — O99213 Obesity complicating pregnancy, third trimester: Secondary | ICD-10-CM

## 2021-12-10 DIAGNOSIS — Z3483 Encounter for supervision of other normal pregnancy, third trimester: Secondary | ICD-10-CM

## 2021-12-10 NOTE — Progress Notes (Signed)
Pt c/o acid reflux.

## 2021-12-10 NOTE — Patient Instructions (Signed)
Safe Medications in Pregnancy    Acne: Benzoyl Peroxide Salicylic Acid  Backache/Headache: Tylenol: 2 regular strength every 4 hours OR              2 Extra strength every 6 hours  Colds/Coughs/Allergies: Benadryl (alcohol free) 25 mg every 6 hours as needed Breath right strips Claritin Cepacol throat lozenges Chloraseptic throat spray Cold-Eeze- up to three times per day Cough drops, alcohol free Flonase (by prescription only) Guaifenesin Mucinex Robitussin DM (plain only, alcohol free) Saline nasal spray/drops Sudafed (pseudoephedrine) & Actifed ** use only after [redacted] weeks gestation and if you do not have high blood pressure Tylenol Vicks Vaporub Zinc lozenges Zyrtec   Constipation: Colace Ducolax suppositories Fleet enema Glycerin suppositories Metamucil Milk of magnesia Miralax Senokot Smooth move tea  Diarrhea: Kaopectate Imodium A-D  *NO pepto Bismol  Hemorrhoids: Anusol Anusol HC Preparation H Tucks  Indigestion/Heartburn: Tums Maalox Mylanta Zantac  Pepcid  Insomnia: Benadryl (alcohol free) 25mg every 6 hours as needed Tylenol PM Unisom, no Gelcaps  Leg Cramps: Tums MagGel  Nausea/Vomiting:  Bonine Dramamine Emetrol Ginger extract Sea bands Meclizine  Nausea medication to take during pregnancy:  Unisom (doxylamine succinate 25 mg tablets) Take one tablet daily at bedtime. If symptoms are not adequately controlled, the dose can be increased to a maximum recommended dose of two tablets daily (1/2 tablet in the morning, 1/2 tablet mid-afternoon and one at bedtime). Vitamin B6 100mg tablets. Take one tablet twice a day (up to 200 mg per day).  Skin Rashes: Aveeno products Benadryl cream or 25mg every 6 hours as needed Calamine Lotion 1% cortisone cream  Yeast infection: Gyne-lotrimin 7 Monistat 7   **If taking multiple medications, please check labels to avoid duplicating the same active ingredients **take  medication as directed on the label ** Do not exceed 4000 mg of tylenol in 24 hours **Do not take medications that contain aspirin or ibuprofen    

## 2021-12-10 NOTE — Progress Notes (Signed)
Subjective:  Rita Stokes is a 36 y.o. G2P1001 at 62w4dbeing seen today for ongoing prenatal care.  She is currently monitored for the following issues for this low-risk pregnancy and has Rubella non-immune status, antepartum; Childhood asthma; Supervision of other normal pregnancy, antepartum; History of gestational hypertension; Obesity (BMI 30-39.9); Anxiety during pregnancy; Pure hypercholesterolemia; Excessive weight gain affecting pregnancy; and Obesity affecting pregnancy on their problem list.  Patient reports heartburn, she's tried TUMS.  Contractions: Not present. Vag. Bleeding: None.  Movement: Present. Denies leaking of fluid.   The following portions of the patient's history were reviewed and updated as appropriate: allergies, current medications, past family history, past medical history, past social history, past surgical history and problem list. Problem list updated.  Objective:   Vitals:   12/10/21 0836  BP: 108/74  Pulse: 92  Weight: 279 lb (126.6 kg)    Fetal Status: Fetal Heart Rate (bpm): 141 Fundal Height: 36 cm Movement: Present  Presentation: Undeterminable  General:  Alert, oriented and cooperative. Patient is in no acute distress.  Skin: Skin is warm and dry. No rash noted.   Cardiovascular: Normal heart rate noted  Respiratory: Normal respiratory effort, no problems with respiration noted  Abdomen: Soft, gravid, appropriate for gestational age. Pain/Pressure: Absent     Pelvic: Vag. Bleeding: None Vag D/C Character: Thin   Cervical exam deferred        Extremities: Normal range of motion.  Edema: Trace  Mental Status: Normal mood and affect. Normal behavior. Normal judgment and thought content.   Urinalysis:      Assessment and Plan:  Pregnancy: G2P1001 at 36w4d1. Excessive weight gain affecting pregnancy  2. [redacted] weeks gestation of pregnancy  3. Supervision of other normal pregnancy, antepartum  4. Rubella non-immune status, antepartum -MMR  pp  5. Obesity affecting pregnancy in third trimester -growth [place] week  6. Heartburn -Pepcid -diet modifications   Preterm labor symptoms and general obstetric precautions including but not limited to vaginal bleeding, contractions, leaking of fluid and fetal movement were reviewed in detail with the patient. Please refer to After Visit Summary for other counseling recommendations.  Return in about 2 weeks (around 12/24/2021).   BhJulianne HandlerCNM

## 2021-12-13 ENCOUNTER — Encounter: Payer: Self-pay | Admitting: Certified Nurse Midwife

## 2021-12-14 ENCOUNTER — Encounter: Payer: Self-pay | Admitting: Obstetrics and Gynecology

## 2021-12-17 ENCOUNTER — Ambulatory Visit (HOSPITAL_BASED_OUTPATIENT_CLINIC_OR_DEPARTMENT_OTHER): Payer: BC Managed Care – PPO

## 2021-12-17 ENCOUNTER — Ambulatory Visit: Payer: BC Managed Care – PPO | Attending: Obstetrics and Gynecology | Admitting: *Deleted

## 2021-12-17 ENCOUNTER — Other Ambulatory Visit: Payer: Self-pay | Admitting: *Deleted

## 2021-12-17 VITALS — BP 115/69 | HR 78

## 2021-12-17 DIAGNOSIS — F419 Anxiety disorder, unspecified: Secondary | ICD-10-CM

## 2021-12-17 DIAGNOSIS — O09523 Supervision of elderly multigravida, third trimester: Secondary | ICD-10-CM | POA: Diagnosis not present

## 2021-12-17 DIAGNOSIS — Z3A35 35 weeks gestation of pregnancy: Secondary | ICD-10-CM | POA: Insufficient documentation

## 2021-12-17 DIAGNOSIS — O99213 Obesity complicating pregnancy, third trimester: Secondary | ICD-10-CM | POA: Diagnosis not present

## 2021-12-17 DIAGNOSIS — O09522 Supervision of elderly multigravida, second trimester: Secondary | ICD-10-CM

## 2021-12-17 DIAGNOSIS — E669 Obesity, unspecified: Secondary | ICD-10-CM | POA: Insufficient documentation

## 2021-12-17 DIAGNOSIS — Z8759 Personal history of other complications of pregnancy, childbirth and the puerperium: Secondary | ICD-10-CM

## 2021-12-17 DIAGNOSIS — O09529 Supervision of elderly multigravida, unspecified trimester: Secondary | ICD-10-CM

## 2021-12-17 DIAGNOSIS — O2600 Excessive weight gain in pregnancy, unspecified trimester: Secondary | ICD-10-CM

## 2021-12-17 DIAGNOSIS — Z2839 Other underimmunization status: Secondary | ICD-10-CM

## 2021-12-17 DIAGNOSIS — O09293 Supervision of pregnancy with other poor reproductive or obstetric history, third trimester: Secondary | ICD-10-CM

## 2021-12-17 DIAGNOSIS — Z3480 Encounter for supervision of other normal pregnancy, unspecified trimester: Secondary | ICD-10-CM

## 2021-12-20 ENCOUNTER — Encounter: Payer: Self-pay | Admitting: Obstetrics and Gynecology

## 2021-12-22 ENCOUNTER — Telehealth: Payer: Self-pay | Admitting: *Deleted

## 2021-12-22 NOTE — Telephone Encounter (Cosign Needed)
Received a message from [facility] that pt's BP this morning was 145/80.  I called pt and she said she didn't sleep well because she was having Braxton Hicks around 1:00 for about an hour.  She denies any feet swelling but does admit that her hands are swollen.  She states that she does have carpal tunnel.  She denies any headaches or visual changes.  Encouraged her to take her BP again today when the house is quiet and she is rested.  If BP continues to be elevated> 150/90 then to go to MAU.  She does have an appt tomorrow in office.  If pt has any changes with contractions, water breaking or headaches she will go to MAU.

## 2021-12-23 ENCOUNTER — Ambulatory Visit (INDEPENDENT_AMBULATORY_CARE_PROVIDER_SITE_OTHER): Payer: BC Managed Care – PPO | Admitting: Obstetrics and Gynecology

## 2021-12-23 ENCOUNTER — Other Ambulatory Visit (HOSPITAL_COMMUNITY)
Admission: RE | Admit: 2021-12-23 | Discharge: 2021-12-23 | Disposition: A | Discharge status: Home / Self Care | Payer: BC Managed Care – PPO | Source: Ambulatory Visit | Attending: Obstetrics and Gynecology | Admitting: Obstetrics and Gynecology

## 2021-12-23 ENCOUNTER — Encounter: Payer: Self-pay | Admitting: Obstetrics and Gynecology

## 2021-12-23 VITALS — BP 126/80 | HR 93 | Wt 288.0 lb

## 2021-12-23 DIAGNOSIS — Z8759 Personal history of other complications of pregnancy, childbirth and the puerperium: Secondary | ICD-10-CM

## 2021-12-23 DIAGNOSIS — Z3480 Encounter for supervision of other normal pregnancy, unspecified trimester: Secondary | ICD-10-CM | POA: Diagnosis not present

## 2021-12-23 DIAGNOSIS — O99213 Obesity complicating pregnancy, third trimester: Secondary | ICD-10-CM

## 2021-12-23 LAB — OB RESULTS CONSOLE GBS: GBS: NEGATIVE

## 2021-12-23 NOTE — Progress Notes (Signed)
   PRENATAL VISIT NOTE  Subjective:  Catilyn Boggus is a 36 y.o. G2P1001 at [redacted]w[redacted]d being seen today for ongoing prenatal care.  She is currently monitored for the following issues for this low-risk pregnancy and has Rubella non-immune status, antepartum; Childhood asthma; Supervision of other normal pregnancy, antepartum; History of gestational hypertension; Obesity (BMI 30-39.9); Anxiety during pregnancy; Pure hypercholesterolemia; Excessive weight gain affecting pregnancy; and Obesity affecting pregnancy on their problem list.  Patient reports no complaints.  Contractions: Irritability. Vag. Bleeding: None.  Movement: Present. Denies leaking of fluid.   The following portions of the patient's history were reviewed and updated as appropriate: allergies, current medications, past family history, past medical history, past social history, past surgical history and problem list.   Objective:   Vitals:   12/23/21 0852  BP: 126/80  Pulse: 93  Weight: 288 lb (130.6 kg)    Fetal Status: Fetal Heart Rate (bpm): 137 Fundal Height: 37 cm Movement: Present     General:  Alert, oriented and cooperative. Patient is in no acute distress.  Skin: Skin is warm and dry. No rash noted.   Cardiovascular: Normal heart rate noted  Respiratory: Normal respiratory effort, no problems with respiration noted  Abdomen: Soft, gravid, appropriate for gestational age.  Pain/Pressure: Present     Pelvic: Cervical exam performed in the presence of a chaperone Dilation: Closed Effacement (%): Thick Station: Ballotable  Extremities: Normal range of motion.  Edema: Mild pitting, slight indentation  Mental Status: Normal mood and affect. Normal behavior. Normal judgment and thought content.   Assessment and Plan:  Pregnancy: G2P1001 at [redacted]w[redacted]d 1. Supervision of other normal pregnancy, antepartum Patient is doing well without complaints Cultures collected Reviewed recent ultrasound with EFW 3520 gm (>99%tile)- Pelvic  proven to 9lb Follow up growth 9/14 Oblique lie on last scan- check presentation next visit  2. History of gestational hypertension Normotensive  3. Obesity affecting pregnancy in third trimester Excess weight gain in pregnancy  Preterm labor symptoms and general obstetric precautions including but not limited to vaginal bleeding, contractions, leaking of fluid and fetal movement were reviewed in detail with the patient. Please refer to After Visit Summary for other counseling recommendations.   Return in about 1 week (around 12/30/2021) for in person, ROB, Low risk.  Future Appointments  Date Time Provider Department Center  12/31/2021  8:30 AM Rasch, Harolyn Rutherford, NP [facility]  01/06/2022  8:10 AM Milas Hock, MD [facility]  01/13/2022  7:30 AM [facility] NURSE [facility]  01/13/2022  7:45 AM [facility] US4 [facility]  01/14/2022  8:10 AM Rasch, Harolyn Rutherford, NP [facility]  01/21/2022  8:50 AM Armando Reichert, CNM [facility]    Catalina Antigua, MD

## 2021-12-24 LAB — CERVICOVAGINAL ANCILLARY ONLY
Chlamydia: NEGATIVE
Comment: NEGATIVE
Comment: NORMAL
Neisseria Gonorrhea: NEGATIVE

## 2021-12-26 LAB — CULTURE, BETA STREP (GROUP B ONLY)
MICRO NUMBER:: 13826474
SPECIMEN QUALITY:: ADEQUATE

## 2021-12-31 ENCOUNTER — Telehealth (HOSPITAL_COMMUNITY): Payer: Self-pay | Admitting: *Deleted

## 2021-12-31 ENCOUNTER — Ambulatory Visit (INDEPENDENT_AMBULATORY_CARE_PROVIDER_SITE_OTHER): Payer: BC Managed Care – PPO | Admitting: Obstetrics and Gynecology

## 2021-12-31 ENCOUNTER — Encounter (HOSPITAL_COMMUNITY): Payer: Self-pay | Admitting: *Deleted

## 2021-12-31 ENCOUNTER — Other Ambulatory Visit: Payer: Self-pay | Admitting: Obstetrics and Gynecology

## 2021-12-31 VITALS — BP 129/79 | HR 108 | Wt 284.0 lb

## 2021-12-31 DIAGNOSIS — Z3A37 37 weeks gestation of pregnancy: Secondary | ICD-10-CM

## 2021-12-31 DIAGNOSIS — O3663X0 Maternal care for excessive fetal growth, third trimester, not applicable or unspecified: Secondary | ICD-10-CM

## 2021-12-31 DIAGNOSIS — Z3480 Encounter for supervision of other normal pregnancy, unspecified trimester: Secondary | ICD-10-CM

## 2021-12-31 DIAGNOSIS — Z8759 Personal history of other complications of pregnancy, childbirth and the puerperium: Secondary | ICD-10-CM

## 2021-12-31 MED ORDER — CYCLOBENZAPRINE HCL 10 MG PO TABS: 10.0000 mg | ORAL_TABLET | Freq: Three times a day (TID) | ORAL | 0 refills | Status: DC | PRN

## 2021-12-31 NOTE — Progress Notes (Signed)
   PRENATAL VISIT NOTE  Subjective:  Rita Stokes is a 36 y.o. G2P1001 at [redacted]w[redacted]d being seen today for ongoing prenatal care.  She is currently monitored for the following issues for this low-risk pregnancy and has Rubella non-immune status, antepartum; Childhood asthma; Supervision of other normal pregnancy, antepartum; History of gestational hypertension; Obesity (BMI 30-39.9); Anxiety during pregnancy; Pure hypercholesterolemia; Excessive weight gain affecting pregnancy; and Obesity affecting pregnancy on their problem list.  Patient reports  feet swelling, lower back pain.  .  Contractions: Irritability. Vag. Bleeding: None.  Movement: Present. Denies leaking of fluid.   The following portions of the patient's history were reviewed and updated as appropriate: allergies, current medications, past family history, past medical history, past social history, past surgical history and problem list.   Objective:   Vitals:   12/31/21 0821  BP: 129/79  Pulse: (!) 108  Weight: 284 lb (128.8 kg)    Fetal Status:     Movement: Present     General:  Alert, oriented and cooperative. Patient is in no acute distress.  Skin: Skin is warm and dry. No rash noted.   Cardiovascular: Normal heart rate noted  Respiratory: Normal respiratory effort, no problems with respiration noted  Abdomen: Soft, gravid, appropriate for gestational age.  Pain/Pressure: Present     Pelvic: Cervical exam performed in the presence of a chaperone        Extremities: Normal range of motion.  Edema: Mild pitting, slight indentation  Mental Status: Normal mood and affect. Normal behavior. Normal judgment and thought content.   Assessment and Plan:  Pregnancy: G2P1001 at [redacted]w[redacted]d   1. Supervision of other normal pregnancy, antepartum  - Having trouble sleeping d/t swelling in feet and lower back pain.  - Rx Flexeril - Recommend OTC magnesium 400 mg PO at bedtime - Keep track of BP at home.   2. History of gestational  hypertension  - BP good today.  - Continue BASA - Plan for induction @ 39 weeks if spontaneous delivery does not happen. She is appreciative of this.    3. Excessive fetal growth affecting management of pregnancy in third trimester, single or unspecified fetus  - Last Growth shows 3520 grams   Term labor symptoms and general obstetric precautions including but not limited to vaginal bleeding, contractions, leaking of fluid and fetal movement were reviewed in detail with the patient. Please refer to After Visit Summary for other counseling recommendations.   No follow-ups on file.  Future Appointments  Date Time Provider Department Center  01/06/2022  8:10 AM Milas Hock, MD [facility]  01/13/2022  7:30 AM [facility] NURSE [facility]  01/13/2022  7:45 AM [facility] US4 [facility]  01/14/2022  8:10 AM Nialah Saravia, Harolyn Rutherford, NP [facility]  01/21/2022  8:50 AM Armando Reichert, CNM [facility]    Venia Carbon, NP

## 2021-12-31 NOTE — Telephone Encounter (Signed)
Preadmission screen  

## 2022-01-01 ENCOUNTER — Other Ambulatory Visit: Payer: Self-pay | Admitting: Advanced Practice Midwife

## 2022-01-01 ENCOUNTER — Inpatient Hospital Stay (HOSPITAL_COMMUNITY)
Admission: AD | Admit: 2022-01-01 | Discharge: 2022-01-05 | DRG: 798 | Disposition: A | Discharge status: Home / Self Care | Payer: BC Managed Care – PPO | Attending: Obstetrics & Gynecology | Admitting: Obstetrics & Gynecology

## 2022-01-01 ENCOUNTER — Encounter (HOSPITAL_COMMUNITY): Payer: Self-pay | Admitting: Family Medicine

## 2022-01-01 DIAGNOSIS — O133 Gestational [pregnancy-induced] hypertension without significant proteinuria, third trimester: Principal | ICD-10-CM

## 2022-01-01 DIAGNOSIS — O134 Gestational [pregnancy-induced] hypertension without significant proteinuria, complicating childbirth: Principal | ICD-10-CM | POA: Diagnosis present

## 2022-01-01 DIAGNOSIS — O139 Gestational [pregnancy-induced] hypertension without significant proteinuria, unspecified trimester: Secondary | ICD-10-CM | POA: Diagnosis present

## 2022-01-01 DIAGNOSIS — Z23 Encounter for immunization: Secondary | ICD-10-CM

## 2022-01-01 DIAGNOSIS — O99214 Obesity complicating childbirth: Secondary | ICD-10-CM | POA: Diagnosis present

## 2022-01-01 DIAGNOSIS — Z8759 Personal history of other complications of pregnancy, childbirth and the puerperium: Secondary | ICD-10-CM

## 2022-01-01 DIAGNOSIS — Z7982 Long term (current) use of aspirin: Secondary | ICD-10-CM

## 2022-01-01 DIAGNOSIS — O3663X0 Maternal care for excessive fetal growth, third trimester, not applicable or unspecified: Secondary | ICD-10-CM | POA: Diagnosis present

## 2022-01-01 DIAGNOSIS — Z3480 Encounter for supervision of other normal pregnancy, unspecified trimester: Secondary | ICD-10-CM

## 2022-01-01 DIAGNOSIS — Z3A37 37 weeks gestation of pregnancy: Secondary | ICD-10-CM

## 2022-01-01 DIAGNOSIS — O09899 Supervision of other high risk pregnancies, unspecified trimester: Secondary | ICD-10-CM

## 2022-01-01 LAB — URINALYSIS, ROUTINE W REFLEX MICROSCOPIC
Bilirubin Urine: NEGATIVE
Glucose, UA: NEGATIVE mg/dL
Hgb urine dipstick: NEGATIVE
Ketones, ur: 5 mg/dL — AB
Leukocytes,Ua: NEGATIVE
Nitrite: NEGATIVE
Protein, ur: NEGATIVE mg/dL
Specific Gravity, Urine: 1.006 (ref 1.005–1.030)
pH: 7 (ref 5.0–8.0)

## 2022-01-01 NOTE — MAU Note (Signed)
.  Rita Stokes is a 36 y.o. at [redacted]w[redacted]d here in MAU reporting: elevated BP, pt states took her bp at 2100 145/96 and last at 2121 154/89. Pt states using the same cuff throughout pregnancy. Pt reports nausea that started around 0700 am today that is newly onset. Pt denies PIH s/s. Pt denies taking any antiemetics, and has been able to eat and drink. Pt states she has been wet in her underwear for the last week, thinks possible leaking fluid, clear and watery. Pt denies DF, VB, LOF, fever, recent intercourse, and complications in the pregnancy.  Hx of GHTN not on medication  Onset of complaint: 0700 Pain score: 0/10 Vitals:   01/01/22 2258  BP: 131/88  Pulse: 88  Resp: 18  Temp: 97.7 F (36.5 C)  SpO2: 97%     FHT:121 Lab orders placed from triage:

## 2022-01-02 ENCOUNTER — Encounter (HOSPITAL_COMMUNITY): Payer: Self-pay | Admitting: Family Medicine

## 2022-01-02 ENCOUNTER — Inpatient Hospital Stay (HOSPITAL_COMMUNITY): Payer: BC Managed Care – PPO | Admitting: Anesthesiology

## 2022-01-02 ENCOUNTER — Other Ambulatory Visit: Payer: Self-pay

## 2022-01-02 DIAGNOSIS — O3663X0 Maternal care for excessive fetal growth, third trimester, not applicable or unspecified: Secondary | ICD-10-CM | POA: Diagnosis present

## 2022-01-02 DIAGNOSIS — O134 Gestational [pregnancy-induced] hypertension without significant proteinuria, complicating childbirth: Secondary | ICD-10-CM | POA: Diagnosis present

## 2022-01-02 DIAGNOSIS — O139 Gestational [pregnancy-induced] hypertension without significant proteinuria, unspecified trimester: Secondary | ICD-10-CM | POA: Diagnosis present

## 2022-01-02 DIAGNOSIS — Z3A37 37 weeks gestation of pregnancy: Secondary | ICD-10-CM | POA: Diagnosis not present

## 2022-01-02 DIAGNOSIS — O99214 Obesity complicating childbirth: Secondary | ICD-10-CM | POA: Diagnosis present

## 2022-01-02 DIAGNOSIS — Z23 Encounter for immunization: Secondary | ICD-10-CM | POA: Diagnosis not present

## 2022-01-02 DIAGNOSIS — Z7982 Long term (current) use of aspirin: Secondary | ICD-10-CM | POA: Diagnosis not present

## 2022-01-02 LAB — COMPREHENSIVE METABOLIC PANEL
ALT: 13 U/L (ref 0–44)
AST: 18 U/L (ref 15–41)
Albumin: 2.5 g/dL — ABNORMAL LOW (ref 3.5–5.0)
Alkaline Phosphatase: 122 U/L (ref 38–126)
Anion gap: 9 (ref 5–15)
BUN: 5 mg/dL — ABNORMAL LOW (ref 6–20)
CO2: 19 mmol/L — ABNORMAL LOW (ref 22–32)
Calcium: 9.5 mg/dL (ref 8.9–10.3)
Chloride: 109 mmol/L (ref 98–111)
Creatinine, Ser: 0.57 mg/dL (ref 0.44–1.00)
GFR, Estimated: >60 mL/min (ref >60)
Glucose, Bld: 96 mg/dL (ref 70–99)
Potassium: 3.8 mmol/L (ref 3.5–5.1)
Sodium: 137 mmol/L (ref 135–145)
Total Bilirubin: 0.4 mg/dL (ref 0.3–1.2)
Total Protein: 6.3 g/dL — ABNORMAL LOW (ref 6.5–8.1)

## 2022-01-02 LAB — PROTEIN / CREATININE RATIO, URINE
Creatinine, Urine: 41 mg/dL
Protein Creatinine Ratio: 0.22 mg/mg{Cre} — ABNORMAL HIGH (ref 0.00–0.15)
Total Protein, Urine: 9 mg/dL

## 2022-01-02 LAB — CBC
HCT: 35.1 % — ABNORMAL LOW (ref 36.0–46.0)
HCT: 33.7 % — ABNORMAL LOW (ref 36.0–46.0)
Hemoglobin: 11.7 g/dL — ABNORMAL LOW (ref 12.0–15.0)
Hemoglobin: 11.7 g/dL — ABNORMAL LOW (ref 12.0–15.0)
MCH: 28.5 pg (ref 26.0–34.0)
MCH: 29.3 pg (ref 26.0–34.0)
MCHC: 33.3 g/dL (ref 30.0–36.0)
MCHC: 34.7 g/dL (ref 30.0–36.0)
MCV: 85.6 fL (ref 80.0–100.0)
MCV: 84.5 fL (ref 80.0–100.0)
Platelets: 253 10*3/uL (ref 150–400)
Platelets: 231 10*3/uL (ref 150–400)
RBC: 4.1 MIL/uL (ref 3.87–5.11)
RBC: 3.99 MIL/uL (ref 3.87–5.11)
RDW: 13.4 % (ref 11.5–15.5)
RDW: 13.3 % (ref 11.5–15.5)
WBC: 9.8 10*3/uL (ref 4.0–10.5)
WBC: 8.1 10*3/uL (ref 4.0–10.5)
nRBC: 0 % (ref 0.0–0.2)
nRBC: 0 % (ref 0.0–0.2)

## 2022-01-02 LAB — WET PREP, GENITAL
Clue Cells Wet Prep HPF POC: NONE SEEN
Sperm: NONE SEEN
Trich, Wet Prep: NONE SEEN
WBC, Wet Prep HPF POC: >=10 — AB (ref <10)
Yeast Wet Prep HPF POC: NONE SEEN

## 2022-01-02 LAB — AMNISURE RUPTURE OF MEMBRANE (ROM) NOT AT ARMC: Amnisure ROM: NEGATIVE

## 2022-01-02 LAB — POCT FERN TEST: POCT Fern Test: NEGATIVE

## 2022-01-02 LAB — RPR: RPR Ser Ql: NONREACTIVE

## 2022-01-02 MED ORDER — PHENYLEPHRINE 80 MCG/ML (10ML) SYRINGE FOR IV PUSH (FOR BLOOD PRESSURE SUPPORT): 80.0000 ug | PREFILLED_SYRINGE | INTRAVENOUS | Status: DC | PRN

## 2022-01-02 MED ORDER — LACTATED RINGERS IV SOLN: INTRAVENOUS | Status: DC

## 2022-01-02 MED ORDER — OXYTOCIN BOLUS FROM INFUSION: 333.0000 mL | Freq: Once | INTRAVENOUS | Status: DC

## 2022-01-02 MED ORDER — LABETALOL HCL 5 MG/ML IV SOLN: 20.0000 mg | INTRAVENOUS | Status: DC | PRN

## 2022-01-02 MED ORDER — TERBUTALINE SULFATE 1 MG/ML IJ SOLN: 0.2500 mg | Freq: Once | INTRAMUSCULAR | Status: DC | PRN

## 2022-01-02 MED ORDER — SOD CITRATE-CITRIC ACID 500-334 MG/5ML PO SOLN
30.0000 mL | ORAL | Status: DC | PRN
  Administered 2022-01-02 – 2022-01-03 (×2): 30 mL via ORAL
  Filled 2022-01-02 (×2): qty 30

## 2022-01-02 MED ORDER — LABETALOL HCL 5 MG/ML IV SOLN: 40.0000 mg | INTRAVENOUS | Status: DC | PRN

## 2022-01-02 MED ORDER — OXYTOCIN-SODIUM CHLORIDE 30-0.9 UT/500ML-% IV SOLN
2.5000 [IU]/h | INTRAVENOUS | Status: DC
  Filled 2022-01-02: qty 500

## 2022-01-02 MED ORDER — FENTANYL-BUPIVACAINE-NACL 0.5-0.125-0.9 MG/250ML-% EP SOLN
12.0000 mL/h | EPIDURAL | Status: DC | PRN
  Administered 2022-01-03: 12 mL/h via EPIDURAL
  Filled 2022-01-02: qty 250

## 2022-01-02 MED ORDER — LACTATED RINGERS IV SOLN: 500.0000 mL | Freq: Once | INTRAVENOUS | Status: DC

## 2022-01-02 MED ORDER — EPHEDRINE 5 MG/ML INJ: 10.0000 mg | INTRAVENOUS | Status: DC | PRN

## 2022-01-02 MED ORDER — DIPHENHYDRAMINE HCL 50 MG/ML IJ SOLN: 12.5000 mg | INTRAMUSCULAR | Status: DC | PRN

## 2022-01-02 MED ORDER — MISOPROSTOL 50MCG HALF TABLET
50.0000 ug | ORAL_TABLET | ORAL | Status: DC | PRN
  Administered 2022-01-02: 50 ug via ORAL
  Filled 2022-01-02: qty 1

## 2022-01-02 MED ORDER — MISOPROSTOL 25 MCG QUARTER TABLET
25.0000 ug | ORAL_TABLET | Freq: Once | ORAL | Status: AC
  Administered 2022-01-02: 25 ug via VAGINAL
  Filled 2022-01-02: qty 1

## 2022-01-02 MED ORDER — LIDOCAINE-EPINEPHRINE (PF) 2 %-1:200000 IJ SOLN
INTRAMUSCULAR | Status: DC | PRN
  Administered 2022-01-02: 5 mL via EPIDURAL

## 2022-01-02 MED ORDER — TRANEXAMIC ACID-NACL 1000-0.7 MG/100ML-% IV SOLN: 1000.0000 mg | INTRAVENOUS | Status: DC

## 2022-01-02 MED ORDER — LACTATED RINGERS IV SOLN: 500.0000 mL | INTRAVENOUS | Status: DC | PRN

## 2022-01-02 MED ORDER — HYDRALAZINE HCL 20 MG/ML IJ SOLN: 10.0000 mg | INTRAMUSCULAR | Status: DC | PRN

## 2022-01-02 MED ORDER — FENTANYL-BUPIVACAINE-NACL 0.5-0.125-0.9 MG/250ML-% EP SOLN
12.0000 mL/h | EPIDURAL | Status: DC | PRN
  Administered 2022-01-02: 12 mL/h via EPIDURAL
  Filled 2022-01-02: qty 250

## 2022-01-02 MED ORDER — OXYCODONE-ACETAMINOPHEN 5-325 MG PO TABS: 2.0000 | ORAL_TABLET | ORAL | Status: DC | PRN

## 2022-01-02 MED ORDER — MISOPROSTOL 50MCG HALF TABLET
50.0000 ug | ORAL_TABLET | Freq: Once | ORAL | Status: AC
  Administered 2022-01-02: 50 ug via ORAL
  Filled 2022-01-02 (×2): qty 1

## 2022-01-02 MED ORDER — PHENYLEPHRINE 80 MCG/ML (10ML) SYRINGE FOR IV PUSH (FOR BLOOD PRESSURE SUPPORT)
80.0000 ug | PREFILLED_SYRINGE | INTRAVENOUS | Status: DC | PRN
Start: 2022-01-02 — End: 2022-01-03

## 2022-01-02 MED ORDER — ACETAMINOPHEN 325 MG PO TABS: 650.0000 mg | ORAL_TABLET | ORAL | Status: DC | PRN

## 2022-01-02 MED ORDER — OXYCODONE-ACETAMINOPHEN 5-325 MG PO TABS: 1.0000 | ORAL_TABLET | ORAL | Status: DC | PRN

## 2022-01-02 MED ORDER — LIDOCAINE HCL (PF) 1 % IJ SOLN: 30.0000 mL | INTRAMUSCULAR | Status: DC | PRN

## 2022-01-02 MED ORDER — LABETALOL HCL 5 MG/ML IV SOLN: 80.0000 mg | INTRAVENOUS | Status: DC | PRN

## 2022-01-02 MED ORDER — OXYTOCIN-SODIUM CHLORIDE 30-0.9 UT/500ML-% IV SOLN
1.0000 m[IU]/min | INTRAVENOUS | Status: DC
  Administered 2022-01-02: 2 m[IU]/min via INTRAVENOUS
  Filled 2022-01-02: qty 500

## 2022-01-02 MED ORDER — ONDANSETRON HCL 4 MG/2ML IJ SOLN: 4.0000 mg | Freq: Four times a day (QID) | INTRAMUSCULAR | Status: DC | PRN

## 2022-01-02 MED ORDER — MISOPROSTOL 25 MCG QUARTER TABLET
25.0000 ug | ORAL_TABLET | Freq: Once | ORAL | Status: AC
  Administered 2022-01-02: 25 ug via ORAL
  Filled 2022-01-02: qty 1

## 2022-01-02 NOTE — Progress Notes (Signed)
Patient ID: Rita Stokes, female   DOB: 11-28-1985, 36 y.o.   MRN: [id number] Doing well Vitals:   01/02/22 1925 01/02/22 1930 01/02/22 2000 01/02/22 2030  BP: 128/70 125/75 123/73 108/78  Pulse: 90 90 94 (!) 102  Resp:      Temp:      TempSrc:      SpO2: 100%     Weight:      Height:       FHR reactive UCs irregular  Dilation: 3 Effacement (%): 20 Cervical Position: Posterior Station: -3 Presentation: Vertex Exam by:: Dequavion Follette CNM  Will give Cytotec 25mg  PO and 25mg  PV

## 2022-01-02 NOTE — MAU Provider Note (Addendum)
History     [id number]  Arrival date and time: 01/01/22 2231    Chief Complaint  Patient presents with   Hypertension   Nausea   Emesis     HPI Rita Stokes is a 36 y.o. at 104w6d by 20wk [place] with PMHx notable for previous history of gHTN, who presents for elevated blood pressure and leakage of fluid.   She reports elevated blood pressures, first noticed today at home at about 9 PM.  Initial blood pressure was 145/96, repeat after about 20 minutes was 154/89.  She has had no headaches, vision changes, right upper quadrant pain.  She has had significant swelling of the lower extremities in the last 1 month.  Has not worsened more recently.  She had a history of gestational hypertension in her previous pregnancy, was diagnosed at the time she was admitted for induction following rupture of membranes.  No acute abnormality of fluid.  She has had consistent leaking of clear fluid on her underwear in the last 3 days.  No abnormal vaginal discharge, no vaginal bleeding.  She feels fetal movement.  O/RH(D) POSITIVE/-- (02/24 1031)  OB History     Gravida  2   Para  1   Term  1   Preterm      AB      Living  1      SAB      IAB      Ectopic      Multiple  0   Live Births  1           Past Medical History:  Diagnosis Date   Anxiety    History of gestational hypertension    History of postpartum hemorrhage, currently pregnant    Medical history non-contributory    Morbid obesity ([place])     Past Surgical History:  Procedure Laterality Date   CYSTECTOMY      Family History  Problem Relation Age of Onset   Arthritis Mother    Arthritis/Rheumatoid Mother    Macular degeneration Mother     Social History   Socioeconomic History   Marital status: Married    Spouse name: Not on file   Number of children: Not on file   Years of education: Not on file   Highest education level: Not on file  Occupational History   Not on file  Tobacco Use   Smoking  status: Never   Smokeless tobacco: Never  Vaping Use   Vaping Use: Never used  Substance and Sexual Activity   Alcohol use: Never   Drug use: Never   Sexual activity: Yes    Birth control/protection: None  Other Topics Concern   Not on file  Social History Narrative   Not on file   Social Determinants of Health   Financial Resource Strain: Not on file  Food Insecurity: Not on file  Transportation Needs: Not on file  Physical Activity: Not on file  Stress: Not on file  Social Connections: Not on file  Intimate Partner Violence: Not on file    No Known Allergies  No current facility-administered medications on file prior to encounter.   Current Outpatient Medications on File Prior to Encounter  Medication Sig Dispense Refill   aspirin 81 MG chewable tablet Chew 1 tablet (81 mg total) by mouth daily. 90 tablet 1   calcium carbonate (TUMS - DOSED IN MG ELEMENTAL CALCIUM) 500 MG chewable tablet Chew 1 tablet by mouth daily.     cyclobenzaprine (  FLEXERIL) 10 MG tablet Take 1 tablet (10 mg total) by mouth 3 (three) times daily as needed for muscle spasms. 30 tablet 0   Prenatal Vit-Fe Fumarate-FA (PRENATAL MULTIVITAMIN) TABS tablet Take 1 tablet by mouth daily at 12 noon.     cetirizine (ZYRTEC) 10 MG tablet Take 10 mg by mouth daily.       ROS Pertinent positives and negative per HPI, all others reviewed and negative  Physical Exam   BP 136/85   Pulse 92   Temp 97.7 F (36.5 C)   Resp 18   Ht 5\' 8"  (1.727 m)   Wt 128.1 kg   LMP 04/20/2021   SpO2 100%   BMI 42.95 kg/m   Patient Vitals for the past 24 hrs:  BP Temp Pulse Resp SpO2 Height Weight  01/02/22 0215 136/85 -- 92 -- 100 % -- --  01/02/22 0200 (!) 151/91 -- 92 -- 99 % -- --  01/02/22 0145 (!) 132/96 -- 83 -- 97 % -- --  01/02/22 0130 (!) 144/89 -- 99 -- 99 % -- --  01/02/22 0120 -- -- -- -- 98 % -- --  01/02/22 0115 (!) 146/84 -- 89 -- 98 % -- --  01/02/22 0110 -- -- -- -- 98 % -- --  01/02/22 0100 (!)  146/95 -- 97 -- 99 % -- --  01/02/22 0050 -- -- -- -- 99 % -- --  01/02/22 0045 (!) 146/91 -- 97 -- 99 % -- --  01/02/22 0040 -- -- -- -- 99 % -- --  01/02/22 0030 (!) 144/94 -- 97 -- 99 % -- --  01/02/22 0020 -- -- -- -- 99 % -- --  01/02/22 0015 (!) 142/94 -- 91 -- 99 % -- --  01/02/22 0010 -- -- -- -- 99 % -- --  01/02/22 0000 (!) 147/89 -- 91 -- 99 % -- --  01/01/22 2350 -- -- -- -- 98 % -- --  01/01/22 2345 (!) 145/87 -- 93 -- 98 % -- --  01/01/22 2340 -- -- -- -- 98 % -- --  01/01/22 2330 (!) 134/90 -- 89 -- 98 % -- --  01/01/22 2320 (!) 138/93 -- 87 -- 99 % -- --  01/01/22 2258 131/88 97.7 F (36.5 C) 88 18 97 % 5\' 8"  (1.727 m) 128.1 kg    Physical Exam Vitals reviewed.  Constitutional:      General: She is not in acute distress.    Appearance: She is well-developed. She is not toxic-appearing.  HENT:     Head: Normocephalic and atraumatic.     Mouth/Throat:     Mouth: Mucous membranes are moist.  Eyes:     Extraocular Movements: Extraocular movements intact.  Cardiovascular:     Rate and Rhythm: Normal rate.  Pulmonary:     Effort: Pulmonary effort is normal. No respiratory distress.  Abdominal:     Palpations: Abdomen is soft.  Skin:    General: Skin is warm and dry.  Neurological:     Mental Status: She is alert and oriented to person, place, and time.  Psychiatric:        Mood and Affect: Mood normal.        Behavior: Behavior normal.      Bedside Ultrasound I performed bedside 03/03/22 to confirm fetal presentation  My interpretation: Singleton fetus, with cephalic presentation.  Appears to have an oblique lie, with buttocks around mom's right quadrant  FHT Baseline 130, moderate variability, +accels, no  decels Toco: painless contractions, every 5-7 mins Cat: 1  Labs Results for orders placed or performed during the hospital encounter of 01/01/22 (from the past 24 hour(s))  Urinalysis, Routine w reflex microscopic     Status: Abnormal   Collection Time:  01/01/22 11:08 PM  Result Value Ref Range   Color, Urine STRAW (A) YELLOW   APPearance CLEAR CLEAR   Specific Gravity, Urine 1.006 1.005 - 1.030   pH 7.0 5.0 - 8.0   Glucose, UA NEGATIVE NEGATIVE mg/dL   Hgb urine dipstick NEGATIVE NEGATIVE   Bilirubin Urine NEGATIVE NEGATIVE   Ketones, ur 5 (A) NEGATIVE mg/dL   Protein, ur NEGATIVE NEGATIVE mg/dL   Nitrite NEGATIVE NEGATIVE   Leukocytes,Ua NEGATIVE NEGATIVE  Protein / creatinine ratio, urine     Status: Abnormal   Collection Time: 01/01/22 11:08 PM  Result Value Ref Range   Creatinine, Urine 41 mg/dL   Total Protein, Urine 9 mg/dL   Protein Creatinine Ratio 0.22 (H) 0.00 - 0.15 mg/mg[Cre]  Fern Test     Status: Normal   Collection Time: 01/02/22 12:04 AM  Result Value Ref Range   POCT Fern Test Negative = intact amniotic membranes   CBC     Status: Abnormal   Collection Time: 01/02/22 12:45 AM  Result Value Ref Range   WBC 9.8 4.0 - 10.5 K/uL   RBC 4.10 3.87 - 5.11 MIL/uL   Hemoglobin 11.7 (L) 12.0 - 15.0 g/dL   HCT 35.1 (L) 36.0 - 46.0 %   MCV 85.6 80.0 - 100.0 fL   MCH 28.5 26.0 - 34.0 pg   MCHC 33.3 30.0 - 36.0 g/dL   RDW 13.4 11.5 - 15.5 %   Platelets 253 150 - 400 K/uL   nRBC 0.0 0.0 - 0.2 %  Comprehensive metabolic panel     Status: Abnormal   Collection Time: 01/02/22 12:45 AM  Result Value Ref Range   Sodium 137 135 - 145 mmol/L   Potassium 3.8 3.5 - 5.1 mmol/L   Chloride 109 98 - 111 mmol/L   CO2 19 (L) 22 - 32 mmol/L   Glucose, Bld 96 70 - 99 mg/dL   BUN 5 (L) 6 - 20 mg/dL   Creatinine, Ser 0.57 0.44 - 1.00 mg/dL   Calcium 9.5 8.9 - 10.3 mg/dL   Total Protein 6.3 (L) 6.5 - 8.1 g/dL   Albumin 2.5 (L) 3.5 - 5.0 g/dL   AST 18 15 - 41 U/L   ALT 13 0 - 44 U/L   Alkaline Phosphatase 122 38 - 126 U/L   Total Bilirubin 0.4 0.3 - 1.2 mg/dL   GFR, Estimated >60 >60 mL/min   Anion gap 9 5 - 15  Amnisure rupture of membrane (rom)not at [facility]     Status: None   Collection Time: 01/02/22  1:18 AM  Result Value Ref  Range   Amnisure ROM NEGATIVE   Wet prep, genital     Status: Abnormal   Collection Time: 01/02/22  1:18 AM  Result Value Ref Range   Yeast Wet Prep HPF POC NONE SEEN NONE SEEN   Trich, Wet Prep NONE SEEN NONE SEEN   Clue Cells Wet Prep HPF POC NONE SEEN NONE SEEN   WBC, Wet Prep HPF POC >=10 (A) <10   Sperm NONE SEEN     Imaging No results found.  MAU Course  Procedures Lab Orders         Wet prep, genital  Urinalysis, Routine w reflex microscopic         CBC         Comprehensive metabolic panel         Protein / creatinine ratio, urine         Amnisure rupture of membrane (rom)not at [facility]    No orders of the defined types were placed in this encounter.  Imaging Orders  No imaging studies ordered today   Assessment and Plan  36 y.o. G2P1001 at [redacted]w[redacted]d with new onset high blood pressure, consistent with gestational hypertension. Labs show no ongoing pre-eclampsia and no severe fxs. Concerned about leakage of fluid, but is amnisure negative.  Discussed with Dr Shawnie Pons. Given new gHTN at term, will admit for IOL.  I performed a bedside US, confirmed vertex presentation, though lie is oblique.  #FWB NST: Reactive  Admit to L&D. Labor admit orders are signed and held.  Labor team informed.  Sheppard Evens MD MPH OB Fellow, Faculty Practice [facility], Center for [facility] 01/02/2022

## 2022-01-02 NOTE — MAU Note (Signed)
Bedside done by Dr. Ladon Applebaum. Verified vertex.

## 2022-01-02 NOTE — Anesthesia Preprocedure Evaluation (Signed)
Anesthesia Evaluation  Patient identified by MRN, date of birth, ID band Patient awake    Reviewed: Allergy & Precautions, Patient's Chart, lab work & pertinent test results  Airway Mallampati: III       Dental no notable dental hx.    Pulmonary    Pulmonary exam normal        Cardiovascular hypertension, Normal cardiovascular exam     Neuro/Psych    GI/Hepatic GERD  Medicated,  Endo/Other    Renal/GU      Musculoskeletal   Abdominal   Peds  Hematology   Anesthesia Other Findings   Reproductive/Obstetrics (+) Pregnancy                             Anesthesia Physical Anesthesia Plan  ASA: 3  Anesthesia Plan: Epidural   Post-op Pain Management:    Induction:   PONV Risk Score and Plan:   Airway Management Planned: Natural Airway and Simple Face Mask  Additional Equipment: None  Intra-op Plan:   Post-operative Plan:   Informed Consent: I have reviewed the patients History and Physical, chart, labs and discussed the procedure including the risks, benefits and alternatives for the proposed anesthesia with the patient or authorized representative who has indicated his/her understanding and acceptance.       Plan Discussed with:   Anesthesia Plan Comments: (Lab Results      Component                Value               Date                      WBC                      8.1                 01/02/2022                HGB                      11.7 (L)            01/02/2022                HCT                      33.7 (L)            01/02/2022                MCV                      84.5                01/02/2022                PLT                      231                 01/02/2022           )        Anesthesia Quick Evaluation

## 2022-01-02 NOTE — Progress Notes (Signed)
Patient ID: Rita Stokes, female   DOB: May 11, 1985, 36 y.o.   MRN: [id number] Pt informed that the ultrasound is considered a limited OB ultrasound and is not intended to be a complete ultrasound exam.  Patient also informed that the ultrasound is not being completed with the intent of assessing for fetal or placental anomalies or any pelvic abnormalities.  Explained that the purpose of today's ultrasound is to assess for presentation.  Patient acknowledges the purpose of the exam and the limitations of the study.    Fetus is presenting vertex, vertex is high

## 2022-01-02 NOTE — Progress Notes (Signed)
Labor Progress Note Rita Stokes is a 36 y.o. G2P1001 at [redacted]w[redacted]d presented for IOL for gHTN  S:  Feeling milk ctx, currently using birth ball  O:  BP (!) 134/91   Pulse 96   Temp 98.6 F (37 C) (Oral)   Resp 20   Ht 5\' 8"  (1.727 m)   Wt 128.1 kg   LMP 04/20/2021   SpO2 99%   BMI 42.95 kg/m  EFM: baseline 125 bpm/ mod variability/ + accels/ no decels  Toco/IUPC: 2-4 SVE: 3/50/-3, post, vtx Pitocin: 8 mu/min  A/P: 36 y.o. G2P1001 [redacted]w[redacted]d  1. Labor: latent 2. FWB: Cat I 3. Pain: analgesia/anesthesia/NO prn 4. gHTN: stable  Cervix feels less favorable now, too high for AROM, recommend stop Pitocin and give dose of Cytotec, pt amendable to plan. Anticipate labor progress and SVD.  [redacted]w[redacted]d, CNM 3:39 PM

## 2022-01-02 NOTE — H&P (Addendum)
Rita Stokes is a 36 y.o. female presenting for new Gestational Hypertension  MAU PROVIDER NOTE: Rita Stokes is a 36 y.o. at [redacted]w[redacted]d by 20wk [place] with PMHx notable for previous history of gHTN, who presents for elevated blood pressure and leakage of fluid.   She reports elevated blood pressures, first noticed today at home at about 9 PM.  Initial blood pressure was 145/96, repeat after about 20 minutes was 154/89.  She has had no headaches, vision changes, right upper quadrant pain.  She has had significant swelling of the lower extremities in the last 1 month.  Has not worsened more recently.  She had a history of transient hypotension in her previous pregnancy, was diagnosed at the time she was admitted for induction following rupture of membranes.    . OB History     Gravida  2   Para  1   Term  1   Preterm      AB      Living  1      SAB      IAB      Ectopic      Multiple  0   Live Births  1          Past Medical History:  Diagnosis Date   Anxiety    History of gestational hypertension    History of postpartum hemorrhage, currently pregnant    Medical history non-contributory    Morbid obesity ([facility])    Past Surgical History:  Procedure Laterality Date   CYSTECTOMY     Family History: family history includes Arthritis in her mother; Arthritis/Rheumatoid in her mother; Macular degeneration in her mother. Social History:  reports that she has never smoked. She has never used smokeless tobacco. She reports that she does not drink alcohol and does not use drugs.     Maternal Diabetes: No Genetic Screening: Normal Maternal Ultrasounds/Referrals: Normal Fetal Ultrasounds or other Referrals:  None Maternal Substance Abuse:  No Significant Maternal Medications:  Meds include: Other: Baby ASA Significant Maternal Lab Results:  Group B Strep negative Number of Prenatal Visits:greater than 3 verified prenatal visits Other Comments:  None  Review of Systems   Constitutional:  Negative for chills and fever.  Eyes:  Negative for visual disturbance.  Respiratory:  Negative for shortness of breath.   Cardiovascular:  Positive for leg swelling (Trace).  Gastrointestinal:  Negative for constipation, diarrhea, nausea and vomiting.  Genitourinary:  Negative for pelvic pain and vaginal bleeding.  Neurological:  Negative for headaches.   Maternal Medical History:  Reason for admission: Nausea. Gestational Hypertension  Contractions: Frequency: irregular.   Perceived severity is mild.   Fetal activity: Perceived fetal activity is normal.   Last perceived fetal movement was within the past hour.   Prenatal complications: PIH.   No bleeding, placental abnormality, pre-eclampsia or preterm labor.   Prenatal Complications - Diabetes: none.     Blood pressure (!) 147/95, pulse 95, temperature 97.7 F (36.5 C), resp. rate 18, height 5\' 8"  (1.727 m), weight 128.1 kg, last menstrual period 04/20/2021, SpO2 100 %, unknown if currently breastfeeding. Maternal Exam:  Uterine Assessment: Contraction strength is mild.  Contraction frequency is irregular.  Abdomen: Patient reports no abdominal tenderness. Fetal presentation: vertex Introitus: Normal vulva. Normal vagina.  Ferning test: not done.  Nitrazine test: not done. Amniotic fluid character: not assessed. Pelvis: adequate for delivery.   Cervix: Cervix evaluated by digital exam.     Fetal Exam Fetal Monitor Review: Mode:  ultrasound.   Baseline rate: 135.  Variability: moderate (6-25 bpm).   Pattern: accelerations present and no decelerations.   Fetal State Assessment: Category I - tracings are normal.   Physical Exam Constitutional:      General: She is not in acute distress.    Appearance: She is not ill-appearing or toxic-appearing.  HENT:     Head: Normocephalic.  Cardiovascular:     Rate and Rhythm: Normal rate.  Pulmonary:     Effort: Pulmonary effort is normal.  Abdominal:      General: There is no distension.     Tenderness: There is no abdominal tenderness. There is no guarding.  Genitourinary:    General: Normal vulva.     Comments: Dilation: Fingertip Effacement (%): Thick Station: Ballotable Exam by:: Wynelle Bourgeois, CNM  Musculoskeletal:        General: Swelling (Trace) present.     Cervical back: Normal range of motion.  Skin:    General: Skin is warm and dry.  Neurological:     General: No focal deficit present.     Mental Status: She is alert.     Deep Tendon Reflexes: Reflexes normal (3+).  Psychiatric:        Mood and Affect: Mood normal.     Prenatal labs: ABO, Rh: O/RH(D) POSITIVE/-- (02/24 1031) Antibody: NO ANTIBODIES DETECTED (02/24 1031) Rubella: <0.90 (02/24 1031) RPR: NON-REACTIVE (06/23 0843)  HBsAg: NON-REACTIVE (02/24 1031)  HIV: NON-REACTIVE (06/23 0843)  GBS:     Assessment/Plan: [place] IUP at [redacted]w[redacted]d Gestational Hypertension History of GHTN past pregnancy LGA 99%ile at 35 weeks (7+12 at 35wks) Hx PPH  Plan: Admit to Labor and Delivery Routine orders IOL beginning with ripening Will give Cytotec PO and PV Attempt Foley when able   Wynelle Bourgeois 01/02/2022, 3:13 AM

## 2022-01-02 NOTE — Anesthesia Procedure Notes (Signed)
Epidural Patient location during procedure: OB Start time: 01/02/2022 6:45 PM End time: 01/02/2022 6:51 PM  Staffing Anesthesiologist: Shelton Silvas, MD Performed: anesthesiologist   Preanesthetic Checklist Completed: patient identified, IV checked, site marked, risks and benefits discussed, surgical consent, monitors and equipment checked, pre-op evaluation and timeout performed  Epidural Patient position: sitting Prep: DuraPrep Patient monitoring: heart rate, continuous pulse ox and blood pressure Approach: midline Location: L3-L4 Injection technique: LOR saline  Needle:  Needle type: Tuohy  Needle gauge: 17 G Needle length: 9 cm Catheter type: closed end flexible Catheter size: 20 Guage Test dose: negative and 1.5% lidocaine  Assessment Events: blood not aspirated, injection not painful, no injection resistance and no paresthesia  Additional Notes LOR @ 6  Patient identified. Risks/Benefits/Options discussed with patient including but not limited to bleeding, infection, nerve damage, paralysis, failed block, incomplete pain control, headache, blood pressure changes, nausea, vomiting, reactions to medications, itching and postpartum back pain. Confirmed with bedside nurse the patient's most recent platelet count. Confirmed with patient that they are not currently taking any anticoagulation, have any bleeding history or any family history of bleeding disorders. Patient expressed understanding and wished to proceed. All questions were answered. Sterile technique was used throughout the entire procedure. Please see nursing notes for vital signs. Test dose was given through epidural catheter and negative prior to continuing to dose epidural or start infusion. Warning signs of high block given to the patient including shortness of breath, tingling/numbness in hands, complete motor block, or any concerning symptoms with instructions to call for help. Patient was given instructions on fall  risk and not to get out of bed. All questions and concerns addressed with instructions to call with any issues or inadequate analgesia.    Reason for block:procedure for pain

## 2022-01-02 NOTE — Progress Notes (Signed)
Labor Progress Note Rita Stokes is a 36 y.o. G2P1001 at [redacted]w[redacted]d presented for IOL for gHTN  S:  Feeling occ ctx. No complaints.  O:  BP 132/86   Pulse 85   Temp 97.9 F (36.6 C) (Oral)   Resp 16   Ht 5\' 8"  (1.727 m)   Wt 128.1 kg   LMP 04/20/2021   SpO2 99%   BMI 42.95 kg/m  EFM: baseline 135 bpm/ mod variability/ + accels/ no decels  Toco/IUPC: irreg w/UI SVE: Dilation: 3 Effacement (%): 60 Station: -2 Presentation: Vertex Exam by:: Kentaro Alewine, CNM   A/P: 36 y.o. G2P1001 [redacted]w[redacted]d  1. Labor: latent 2. FWB: Cat I 3. Pain: analgesia/anesthesia/NO prn 4. gHTN: stable  Recommend Pitocin. Consider AROM later. Anticipate labor progress and SVD.  [redacted]w[redacted]d, CNM 11:30 AM

## 2022-01-03 ENCOUNTER — Encounter (HOSPITAL_COMMUNITY): Payer: Self-pay | Admitting: Family Medicine

## 2022-01-03 ENCOUNTER — Inpatient Hospital Stay (HOSPITAL_COMMUNITY): Payer: BC Managed Care – PPO | Admitting: Anesthesiology

## 2022-01-03 ENCOUNTER — Encounter (HOSPITAL_COMMUNITY)
Admission: AD | Disposition: A | Discharge status: Home / Self Care | Payer: Self-pay | Source: Home / Self Care | Attending: Obstetrics & Gynecology

## 2022-01-03 DIAGNOSIS — Z3A38 38 weeks gestation of pregnancy: Secondary | ICD-10-CM

## 2022-01-03 DIAGNOSIS — O134 Gestational [pregnancy-induced] hypertension without significant proteinuria, complicating childbirth: Secondary | ICD-10-CM | POA: Diagnosis not present

## 2022-01-03 HISTORY — PX: DILATION AND CURETTAGE OF UTERUS: SHX78

## 2022-01-03 LAB — CBC
HCT: 36.7 % (ref 36.0–46.0)
Hemoglobin: 12.3 g/dL (ref 12.0–15.0)
MCH: 28.7 pg (ref 26.0–34.0)
MCHC: 33.5 g/dL (ref 30.0–36.0)
MCV: 85.7 fL (ref 80.0–100.0)
Platelets: 209 10*3/uL (ref 150–400)
RBC: 4.28 MIL/uL (ref 3.87–5.11)
RDW: 13.4 % (ref 11.5–15.5)
WBC: 16.2 10*3/uL — ABNORMAL HIGH (ref 4.0–10.5)
nRBC: 0 % (ref 0.0–0.2)

## 2022-01-03 LAB — DIC (DISSEMINATED INTRAVASCULAR COAGULATION)PANEL
D-Dimer, Quant: 8.24 ug/mL-FEU — ABNORMAL HIGH (ref 0.00–0.50)
Fibrinogen: 501 mg/dL — ABNORMAL HIGH (ref 210–475)
INR: 1.1 (ref 0.8–1.2)
Platelets: 208 10*3/uL (ref 150–400)
Prothrombin Time: 14 seconds (ref 11.4–15.2)
Smear Review: NONE SEEN
aPTT: 30 seconds (ref 24–36)

## 2022-01-03 LAB — PREPARE RBC (CROSSMATCH)

## 2022-01-03 SURGERY — DILATION AND CURETTAGE
Anesthesia: Regional

## 2022-01-03 MED ORDER — MISOPROSTOL 200 MCG PO TABS
1000.0000 ug | ORAL_TABLET | Freq: Once | ORAL | Status: AC
Start: 2022-01-03 — End: 2022-01-03
  Administered 2022-01-03: 1000 ug via RECTAL

## 2022-01-03 MED ORDER — METHYLERGONOVINE MALEATE 0.2 MG/ML IJ SOLN: 0.2000 mg | INTRAMUSCULAR | Status: DC | PRN

## 2022-01-03 MED ORDER — METHYLERGONOVINE MALEATE 0.2 MG PO TABS: 0.2000 mg | ORAL_TABLET | ORAL | Status: DC | PRN

## 2022-01-03 MED ORDER — BENZOCAINE-MENTHOL 20-0.5 % EX AERO
1.0000 | INHALATION_SPRAY | CUTANEOUS | Status: DC | PRN
  Administered 2022-01-03: 1 via TOPICAL
  Filled 2022-01-03: qty 56

## 2022-01-03 MED ORDER — FUROSEMIDE 20 MG PO TABS
20.0000 mg | ORAL_TABLET | Freq: Every day | ORAL | Status: DC
  Administered 2022-01-03 – 2022-01-05 (×3): 20 mg via ORAL
  Filled 2022-01-03 (×4): qty 1

## 2022-01-03 MED ORDER — KETOROLAC TROMETHAMINE 30 MG/ML IJ SOLN: 30.0000 mg | Freq: Once | INTRAMUSCULAR | Status: DC

## 2022-01-03 MED ORDER — FENTANYL CITRATE (PF) 100 MCG/2ML IJ SOLN
INTRAMUSCULAR | Status: AC
  Filled 2022-01-03: qty 2

## 2022-01-03 MED ORDER — METHYLERGONOVINE MALEATE 0.2 MG/ML IJ SOLN
INTRAMUSCULAR | Status: AC
  Administered 2022-01-03: 0.2 mg
  Filled 2022-01-03: qty 1

## 2022-01-03 MED ORDER — DIBUCAINE (PERIANAL) 1 % EX OINT: 1.0000 | TOPICAL_OINTMENT | CUTANEOUS | Status: DC | PRN

## 2022-01-03 MED ORDER — METHYLERGONOVINE MALEATE 0.2 MG/ML IJ SOLN
INTRAMUSCULAR | Status: DC | PRN
  Administered 2022-01-03: .2 mg via INTRAMUSCULAR

## 2022-01-03 MED ORDER — DIPHENHYDRAMINE HCL 25 MG PO CAPS: 25.0000 mg | ORAL_CAPSULE | Freq: Four times a day (QID) | ORAL | Status: DC | PRN

## 2022-01-03 MED ORDER — ACETAMINOPHEN 160 MG/5ML PO SOLN: 1000.0000 mg | Freq: Once | ORAL | Status: DC

## 2022-01-03 MED ORDER — ONDANSETRON HCL 4 MG/2ML IJ SOLN: 4.0000 mg | INTRAMUSCULAR | Status: DC | PRN

## 2022-01-03 MED ORDER — ONDANSETRON HCL 4 MG/2ML IJ SOLN
INTRAMUSCULAR | Status: AC
  Filled 2022-01-03: qty 2

## 2022-01-03 MED ORDER — MEASLES, MUMPS & RUBELLA VAC IJ SOLR
0.5000 mL | Freq: Once | INTRAMUSCULAR | Status: AC
  Administered 2022-01-04: 0.5 mL via SUBCUTANEOUS
  Filled 2022-01-03 (×2): qty 0.5

## 2022-01-03 MED ORDER — ONDANSETRON HCL 4 MG PO TABS: 4.0000 mg | ORAL_TABLET | ORAL | Status: DC | PRN

## 2022-01-03 MED ORDER — DROPERIDOL 2.5 MG/ML IJ SOLN: 0.6250 mg | Freq: Once | INTRAMUSCULAR | Status: DC | PRN

## 2022-01-03 MED ORDER — DOCUSATE SODIUM 100 MG PO CAPS
100.0000 mg | ORAL_CAPSULE | Freq: Two times a day (BID) | ORAL | Status: DC
  Administered 2022-01-04 – 2022-01-05 (×4): 100 mg via ORAL
  Filled 2022-01-03 (×5): qty 1

## 2022-01-03 MED ORDER — PRENATAL MULTIVITAMIN CH
1.0000 | ORAL_TABLET | Freq: Every day | ORAL | Status: DC
  Administered 2022-01-04: 1 via ORAL
  Filled 2022-01-03 (×3): qty 1

## 2022-01-03 MED ORDER — OXYTOCIN-SODIUM CHLORIDE 30-0.9 UT/500ML-% IV SOLN
INTRAVENOUS | Status: DC | PRN
  Administered 2022-01-03: 250 mL via INTRAVENOUS

## 2022-01-03 MED ORDER — TRANEXAMIC ACID-NACL 1000-0.7 MG/100ML-% IV SOLN
1000.0000 mg | INTRAVENOUS | Status: AC
  Administered 2022-01-03: 1000 mg via INTRAVENOUS

## 2022-01-03 MED ORDER — ONDANSETRON HCL 4 MG/2ML IJ SOLN
INTRAMUSCULAR | Status: DC | PRN
  Administered 2022-01-03: 4 mg via INTRAVENOUS

## 2022-01-03 MED ORDER — TRANEXAMIC ACID-NACL 1000-0.7 MG/100ML-% IV SOLN
INTRAVENOUS | Status: AC
  Filled 2022-01-03: qty 100

## 2022-01-03 MED ORDER — LACTATED RINGERS IV SOLN: INTRAVENOUS | Status: DC

## 2022-01-03 MED ORDER — TETANUS-DIPHTH-ACELL PERTUSSIS 5-2.5-18.5 LF-MCG/0.5 IM SUSY
0.5000 mL | PREFILLED_SYRINGE | Freq: Once | INTRAMUSCULAR | Status: DC
  Filled 2022-01-03: qty 0.5

## 2022-01-03 MED ORDER — LACTATED RINGERS IV SOLN: INTRAVENOUS | Status: DC | PRN

## 2022-01-03 MED ORDER — LIDOCAINE-EPINEPHRINE (PF) 2 %-1:200000 IJ SOLN
INTRAMUSCULAR | Status: AC
  Filled 2022-01-03: qty 20

## 2022-01-03 MED ORDER — BUPIVACAINE HCL (PF) 0.25 % IJ SOLN
INTRAMUSCULAR | Status: DC | PRN
  Administered 2022-01-03: 8 mL via EPIDURAL

## 2022-01-03 MED ORDER — SENNOSIDES-DOCUSATE SODIUM 8.6-50 MG PO TABS
2.0000 | ORAL_TABLET | ORAL | Status: DC
  Administered 2022-01-03: 2 via ORAL
  Filled 2022-01-03 (×3): qty 2

## 2022-01-03 MED ORDER — ACETAMINOPHEN 325 MG PO TABS: 650.0000 mg | ORAL_TABLET | ORAL | Status: DC | PRN

## 2022-01-03 MED ORDER — WITCH HAZEL-GLYCERIN EX PADS: 1.0000 | MEDICATED_PAD | CUTANEOUS | Status: DC | PRN

## 2022-01-03 MED ORDER — OXYTOCIN-SODIUM CHLORIDE 30-0.9 UT/500ML-% IV SOLN
1.0000 m[IU]/min | INTRAVENOUS | Status: DC
  Administered 2022-01-03: 2 m[IU]/min via INTRAVENOUS
  Filled 2022-01-03: qty 500

## 2022-01-03 MED ORDER — MISOPROSTOL 200 MCG PO TABS
ORAL_TABLET | ORAL | Status: AC
  Filled 2022-01-03: qty 5

## 2022-01-03 MED ORDER — SIMETHICONE 80 MG PO CHEW: 80.0000 mg | CHEWABLE_TABLET | ORAL | Status: DC | PRN

## 2022-01-03 MED ORDER — SODIUM BICARBONATE 8.4 % IV SOLN
INTRAVENOUS | Status: AC
  Filled 2022-01-03: qty 50

## 2022-01-03 MED ORDER — IBUPROFEN 600 MG PO TABS
600.0000 mg | ORAL_TABLET | Freq: Four times a day (QID) | ORAL | Status: DC
  Administered 2022-01-03 – 2022-01-05 (×8): 600 mg via ORAL
  Filled 2022-01-03 (×8): qty 1

## 2022-01-03 MED ORDER — FERROUS SULFATE 325 (65 FE) MG PO TABS
325.0000 mg | ORAL_TABLET | Freq: Two times a day (BID) | ORAL | Status: DC
  Administered 2022-01-03 – 2022-01-05 (×4): 325 mg via ORAL
  Filled 2022-01-03 (×5): qty 1

## 2022-01-03 MED ORDER — ACETAMINOPHEN 500 MG PO TABS: 1000.0000 mg | ORAL_TABLET | Freq: Once | ORAL | Status: DC

## 2022-01-03 MED ORDER — SODIUM CHLORIDE 0.9 % IR SOLN
Status: DC | PRN
  Administered 2022-01-03: 1

## 2022-01-03 MED ORDER — COCONUT OIL OIL: 1.0000 | TOPICAL_OIL | Status: DC | PRN

## 2022-01-03 MED ORDER — DEXMEDETOMIDINE HCL IN NACL 80 MCG/20ML IV SOLN
INTRAVENOUS | Status: AC
  Filled 2022-01-03: qty 20

## 2022-01-03 MED ORDER — LIDOCAINE-EPINEPHRINE (PF) 2 %-1:200000 IJ SOLN
INTRAMUSCULAR | Status: DC | PRN
  Administered 2022-01-03: 5 mL via EPIDURAL
  Administered 2022-01-03: 2 mL via EPIDURAL

## 2022-01-03 MED ORDER — FENTANYL CITRATE (PF) 100 MCG/2ML IJ SOLN
INTRAMUSCULAR | Status: DC | PRN
  Administered 2022-01-03: 100 ug via EPIDURAL

## 2022-01-03 MED ORDER — FENTANYL CITRATE (PF) 100 MCG/2ML IJ SOLN: 25.0000 ug | INTRAMUSCULAR | Status: DC | PRN

## 2022-01-03 MED ORDER — PHENYLEPHRINE HCL (PRESSORS) 10 MG/ML IV SOLN
INTRAVENOUS | Status: DC | PRN
  Administered 2022-01-03 (×3): 160 ug via INTRAVENOUS
  Administered 2022-01-03 (×2): 240 ug via INTRAVENOUS

## 2022-01-03 MED ORDER — SODIUM CHLORIDE 0.9 % IV SOLN: INTRAVENOUS | Status: DC | PRN

## 2022-01-03 MED ORDER — OXYCODONE HCL 5 MG PO TABS: 10.0000 mg | ORAL_TABLET | ORAL | Status: DC | PRN

## 2022-01-03 MED ORDER — SODIUM CHLORIDE 0.9 % IV SOLN
1.0000 g | Freq: Two times a day (BID) | INTRAVENOUS | Status: AC
  Administered 2022-01-03 – 2022-01-04 (×2): 1 g via INTRAVENOUS
  Filled 2022-01-03 (×3): qty 1

## 2022-01-03 MED ORDER — PHENYLEPHRINE 80 MCG/ML (10ML) SYRINGE FOR IV PUSH (FOR BLOOD PRESSURE SUPPORT)
PREFILLED_SYRINGE | INTRAVENOUS | Status: AC
  Filled 2022-01-03: qty 20

## 2022-01-03 MED ORDER — OXYCODONE HCL 5 MG PO TABS: 5.0000 mg | ORAL_TABLET | ORAL | Status: DC | PRN

## 2022-01-03 MED ORDER — ZOLPIDEM TARTRATE 5 MG PO TABS: 5.0000 mg | ORAL_TABLET | Freq: Every evening | ORAL | Status: DC | PRN

## 2022-01-03 SURGICAL SUPPLY — 2 items
SUT VIC AB 3-0 CT1 27 (SUTURE) ×1
SUT VIC AB 3-0 CT1 TAPERPNT 27 (SUTURE) IMPLANT

## 2022-01-03 NOTE — Lactation Note (Signed)
This note was copied from a baby's chart. Lactation Consultation Note  Patient Name: Rita Stokes Date: 01/03/2022   Age:36 hours RN [place] C[place]as) , will ask Birth Parent at next infant assessment if she would like to be seen by [facility] services tonight.  Maternal Data    Feeding    LATCH Score                    Lactation Tools Discussed/Used    Interventions    Discharge    Consult Status      Rita Stokes 01/03/2022, 10:43 PM

## 2022-01-03 NOTE — Progress Notes (Signed)
    Faculty Practice OB/GYN Attending Note  Subjective:  Called to evaluate patient with retained placenta and increased bleeding s/p recent vaginal delivery.  Of note, this happened during her previous delivery and she required manual extraction of her placenta, she also had PPH. Current EBL ~700 ml      Objective:  Blood pressure 111/63, pulse 80, temperature 98.1 F (36.7 C), temperature source Oral, resp. rate 16, height 5\' 8"  (1.727 m), weight 128.1 kg, last menstrual period 04/20/2021, SpO2 100 %, unknown if currently breastfeeding. Gen: NAD HENT: Normocephalic, atraumatic Lungs: Normal respiratory effort Heart: Regular rate noted Abdomen: NT soft Ext: 2+ DTRs, no edema, no cyanosis, negative Homan's sign Pelvic: Placenta palpated inside uterus, noted to be very anterior and high up in uterus and unable to remove in room due to patient's discomfort.   Assessment & Plan:  36 y.o. 30 s/p recent delivery now complicated by retained placenta with hemorrhage.  Recommended manual extraction of placenta under anesthesia and possible curettage. Risks of further bleeding which may require further intervention such as transfusion or further procedures, with hysterectomy as a last resort was discussed with patient. Also discussed risks of infection, injury to uterus or surrounding organs.  She was typed and crossmatched for two units of pRBCs. Written consent obtained. Anesthesia and OR teams aware. To OR when ready.    [id number], MD, FACOG Obstetrician & Gynecologist, [facility] for [facility]

## 2022-01-03 NOTE — Op Note (Signed)
Crystal Scarberry PROCEDURE DATE: 01/01/2022 - 01/03/2022  PREOPERATIVE DIAGNOSIS: Retained placenta with hemorrhage after term vaginal delivery POSTOPERATIVE DIAGNOSIS: The same PROCEDURE:  Evacuation of retained placenta, curettage SURGEON:  Dr. Jaynie Collins  INDICATIONS: 36 y.o. G2P1001 with retained placenta with hemorrhage after term vaginal delivery, needing surgical evacuation. Risks of surgery were discussed with the patient including but not limited to: further bleeding which may require transfusion; infection which may require antibiotics; injury to uterus or surrounding organs; need for additional procedures; possibility of intrauterine scarring which may impair future fertility; and other postoperative/anesthesia complications. Written informed consent was obtained.    FINDINGS:  Retained placenta adherent to fundal portion of uterus that was manually extracted.  Suction curette used to confirm complete emptying. Patient received Methergine 0.2 mg IM and Misoprostol 1000 mcg placed per rectum to help with bleeding.  ANESTHESIA:   Epidural INTRAVENOUS FLUIDS:  1000 ml of LR and 2 pRBCs ESTIMATED BLOOD LOSS:  2500 ml estimated including blood loss in delivery room and OR SPECIMENS:  Placenta sections  sent to pathology COMPLICATIONS:  None immediate.  PROCEDURE DETAILS:  The patient was taken to the operating room where her epidural anesthesia was adjusted and was found to be adequate.  After an adequate timeout was performed, she was placed in the dorsal lithotomy position and examined; then prepped and draped in the sterile manner. .  Manual extraction of the placenta was done; the placenta was noted to be tightly adherent to the top fundal portion of the uterus.  A vaginal speculum was then placed in the patient's vagina and a ring forcep was applied to the anterior lip of the cervix.  Gentle curettage was then performed using the suction curette to confirm complete emptying of the uterus.  There was moderate bleeding noted and the aforementioned uterotonics were given; patient had also been receiving IV pitocin since after delivery.  Her bleeding was noted to be significantly lessened.  All instruments were removed from the patient's vagina.  Sponge and instrument counts were correct times two.  The patient tolerated the procedure well and was taken to the recovery area awake, and in stable condition.   Jaynie Collins, MD, FACOG Attending Obstetrician & Gynecologist Faculty Practice, [facility]

## 2022-01-03 NOTE — Transfer of Care (Signed)
Immediate Anesthesia Transfer of Care Note  Patient: Rita Stokes  Procedure(s) Performed: DILATATION AND CURETTAGE  Patient Location: PACU  Anesthesia Type:Epidural  Level of Consciousness: awake, alert  and oriented  Airway & Oxygen Therapy: Patient Spontanous Breathing  Post-op Assessment: Report given to RN and Post -op Vital signs reviewed and stable  Post vital signs: Reviewed and stable  Last Vitals:  Vitals Value Taken Time  BP 102/70 01/03/22 1605  Temp 37.3 C 01/03/22 1605  Pulse 81 01/03/22 1608  Resp 18 01/03/22 1608  SpO2 99 % 01/03/22 1608  Vitals shown include unvalidated device data.  Last Pain:  Vitals:   01/03/22 1605  TempSrc: Oral  PainSc:          Complications: No notable events documented.

## 2022-01-03 NOTE — Anesthesia Postprocedure Evaluation (Signed)
Anesthesia Post Note  Patient: Rita Stokes  Procedure(s) Performed: DILATATION AND CURETTAGE     Patient location during evaluation: PACU Anesthesia Type: Epidural Level of consciousness: awake and alert Pain management: pain level controlled Vital Signs Assessment: post-procedure vital signs reviewed and stable Respiratory status: spontaneous breathing, nonlabored ventilation and respiratory function stable Cardiovascular status: blood pressure returned to baseline Postop Assessment: epidural receding, no apparent nausea or vomiting, no headache and no backache Anesthetic complications: no   No notable events documented.  Last Vitals:  Vitals:   01/03/22 1635 01/03/22 1645  BP:  121/72  Pulse: 88 93  Resp: (!) 22 19  Temp:    SpO2: 96% 97%    Last Pain:  Vitals:   01/03/22 1615  TempSrc:   PainSc: 0-No pain   Pain Goal:    LLE Motor Response: Purposeful movement (01/03/22 1630) LLE Sensation: Tingling (01/03/22 1630) RLE Motor Response: Purposeful movement (01/03/22 1630) RLE Sensation: Tingling (01/03/22 1630)     Epidural/Spinal Function Cutaneous sensation: Tingles (01/03/22 1630), Patient able to flex knees: Yes (01/03/22 1630), Patient able to lift hips off bed: Yes (01/03/22 1630), Back pain beyond tenderness at insertion site: No (01/03/22 1630), Progressively worsening motor and/or sensory loss: No (01/03/22 1630), Bowel and/or bladder incontinence post epidural: No (01/03/22 1630)  Shanda Howells

## 2022-01-03 NOTE — Anesthesia Preprocedure Evaluation (Signed)
Anesthesia Evaluation  Patient identified by MRN, date of birth, ID band Patient awake    Reviewed: Allergy & Precautions, NPO status , Patient's Chart, lab work & pertinent test results  History of Anesthesia Complications Negative for: history of anesthetic complications  Airway Mallampati: II  TM Distance: >3 FB Neck ROM: Full    Dental no notable dental hx.    Pulmonary neg pulmonary ROS,    Pulmonary exam normal        Cardiovascular hypertension (gHTN), Normal cardiovascular exam     Neuro/Psych Anxiety negative neurological ROS     GI/Hepatic negative GI ROS, Neg liver ROS,   Endo/Other  negative endocrine ROS  Renal/GU negative Renal ROS  negative genitourinary   Musculoskeletal negative musculoskeletal ROS (+)   Abdominal   Peds  Hematology negative hematology ROS (+)   Anesthesia Other Findings Day of surgery medications reviewed with patient.  Reproductive/Obstetrics Retained placenta                             Anesthesia Physical Anesthesia Plan  ASA: 3 and emergent  Anesthesia Plan: Epidural   Post-op Pain Management:    Induction:   PONV Risk Score and Plan: 3 and Treatment may vary due to age or medical condition and Ondansetron  Airway Management Planned: Natural Airway  Additional Equipment: None  Intra-op Plan:   Post-operative Plan:   Informed Consent: I have reviewed the patients History and Physical, chart, labs and discussed the procedure including the risks, benefits and alternatives for the proposed anesthesia with the patient or authorized representative who has indicated his/her understanding and acceptance.       Plan Discussed with: CRNA  Anesthesia Plan Comments:         Anesthesia Quick Evaluation

## 2022-01-03 NOTE — Progress Notes (Signed)
Patient ID: Rita Stokes, female   DOB: 26-Jun-1985, 36 y.o.   MRN: [id number] Rita Stokes is a 36 y.o. G2P1001 at [redacted]w[redacted]d admitted for induction of labor due to gestational hypertension   Subjective: no complaints, comfortable with epidural, and not feeling contractions  Objective: BP 115/68   Pulse 80   Temp 98.1 F (36.7 C) (Oral)   Resp 16   Ht 5\' 8"  (1.727 m)   Wt 128.1 kg   LMP 04/20/2021   SpO2 100%   BMI 42.95 kg/m  No intake/output data recorded.  FHR baseline 135 bpm, Variability: moderate, Accelerations:present, Decelerations:  Present, early, variable Toco:  regular    SVE:   Dilation: 6.5 Effacement (%): 80 Station: -3, -2 Exam by:: [email or web address], SCNM  Pitocin @ 26 mu/min  Assessment / Plan: IOL d/t gestational hypertension , s/p AROM @ 1034, clear fluid. Pitocin: currently at 26 mu/min.   Labor: active Fetal Wellbeing:  Category I Pain Control:  epidural Pre-eclampsia: N/A I/D:  GBS neg Anticipated MOD: NSVB  Koleen Nimrod SNM 01/03/2022, 11:14 AM

## 2022-01-03 NOTE — Progress Notes (Signed)
Labor Progress Note Evee Liska is a 36 y.o. G2P1001 at [redacted]w[redacted]d presented for IOL for gHTN S: Patient is resting comfortably with an epidural.  O:  BP 121/79   Pulse 89   Temp 98.5 F (36.9 C) (Oral)   Resp 16   Ht 5\' 8"  (1.727 m)   Wt 128.1 kg   LMP 04/20/2021   SpO2 100%   BMI 42.95 kg/m  EFM: 125/moderate variability/15x15 accels/no decels  CVE: Dilation: 3 Effacement (%): 20 Cervical Position: Posterior Station: -3 Presentation: Vertex Exam by:: Dr. [email or web address]   A&P: 36 y.o. G2P1001 [redacted]w[redacted]d admitted for IOL for gHTN #Labor: Progressing slowly, previous delivery progressed slowly through the latent phase then had a quick active phase. Will restart pitocin. AROM when able, remains too high. #Pain: well controlled with epidural #FWB: Cat I #GBS negative Obesity gHTN stable  [redacted]w[redacted]d, MD PGY3 Center for [facility], [facility] 1:15 AM

## 2022-01-03 NOTE — Progress Notes (Signed)
Patient ID: Rita Stokes, female   DOB: 02-27-1986, 36 y.o.   MRN: [id number] Rita Stokes is a 36 y.o. G2P1001 at [redacted]w[redacted]d admitted for induction of labor due to gestational hypertension   Subjective: no complaints, comfortable with epidural, and denies headache, visual changes, ruq/epigastric pain, n/v  Objective: BP 126/74   Pulse 87   Temp 98.3 F (36.8 C) (Oral)   Resp 16   Ht 5\' 8"  (1.727 m)   Wt 128.1 kg   LMP 04/20/2021   SpO2 100%   BMI 42.95 kg/m  No intake/output data recorded.  FHR baseline 135 bpm, Variability: moderate, Accelerations:present, Decelerations:  Absent Toco: q 1-6 mins   SVE:   Dilation: 6.5 Effacement (%): 80 Station: -3, -2 Exam by:: [email or web address], SCNM  AROM small amt clear fluid by me  Pitocin @ 26 mu/min  Labs: Lab Results  Component Value Date   WBC 8.1 01/02/2022   HGB 11.7 (L) 01/02/2022   HCT 33.7 (L) 01/02/2022   MCV 84.5 01/02/2022   PLT 231 01/02/2022    Assessment / Plan: IOL d/t GHTN, s/p  cytotec 50/25 (9/3 11-18-1978), pit (9/3 1130-1400), cytotec 50 po (9/3 1600), cytotec 25/25 (9/3 2100), pit was started again @ 0115, is currently @ 24mu/min, now AROM'd  Labor: early Fetal Wellbeing:  Category I Pain Control:  epidural Pre-eclampsia: asymptomatic, bp's stable, and labs stable I/D:  GBS neg Anticipated MOD: NSVB  37m CNM, WHNP-BC 01/03/2022, 10:37 AM

## 2022-01-03 NOTE — Lactation Note (Signed)
This note was copied from a baby's chart. Lactation Consultation Note  Patient Name: Rita Stokes XYOFV'W Date: 01/03/2022   Age:37 hours Birth Parent had visitors will call LC when ready for [facility] consult.  Maternal Data    Feeding Nipple Type: Slow - flow  LATCH Score                    Lactation Tools Discussed/Used    Interventions    Discharge    Consult Status      Danelle Earthly 01/03/2022, 7:35 PM

## 2022-01-03 NOTE — Progress Notes (Signed)
Patient ID: Rita Stokes, female   DOB: May 13, 1985, 36 y.o.   MRN: [id number] Doing well   Vitals:   01/03/22 0432 01/03/22 0500 01/03/22 0530 01/03/22 0606  BP: 116/61 121/67 112/74 114/71  Pulse: 73 95 84 78  Resp:      Temp:      TempSrc:      SpO2:      Weight:      Height:       Dilation: 4.5 Effacement (%): 50 Cervical Position: Posterior Station: -3 Presentation: Vertex Exam by:: Mellissa Kohut, RN  Will continue Pitocin

## 2022-01-03 NOTE — Discharge Summary (Shared)
Postpartum Discharge Summary  Date of Service updated***     Patient Name: Rita Stokes DOB: 12/07/1985 MRN: [id number]  Date of admission: 01/01/2022 Delivery date:01/03/2022  Delivering provider: Wells Guiles R  Date of discharge: 01/03/2022  Admitting diagnosis: History of gestational hypertension [Z87.59] Intrauterine pregnancy: [redacted]w[redacted]d    Secondary diagnosis:  Principal Problem:   History of gestational hypertension Active Problems:   Gestational hypertension  Additional problems: retained placenta w/ PPH and D&C in OR    Discharge diagnosis: Gestational Hypertension                                              Post partum procedures:{Postpartum procedures:23558} Augmentation: AROM, Pitocin, and Cytotec Complications: retained placenta w/ PPH (EBL 25068m, D&C in OR, 2u PRBC  Hospital course: Induction of Labor With Vaginal Delivery   [age].o. yo G2P1001 at 3845w0ds admitted to the hospital 01/01/2022 for induction of labor.  Indication for induction: Gestational hypertension.  Patient had an uncomplicated labor course as follows: Membrane Rupture Time/Date: 10:34 AM ,01/03/2022   Delivery Method:Vaginal, Spontaneous  Episiotomy: None  Lacerations:    Details of delivery can be found in separate delivery note.  Patient had a routine postpartum course. Patient is discharged home 01/03/22.  Newborn Data: Birth date:01/03/2022  Birth time:2:11 PM  Gender:Female  Living status:Living  Apgars:8 ,9  Weight:3860 g   Magnesium Sulfate received: No BMZ received: No Rhophylac:N/A MMR:*** T-DaP:Given prenatally Flu: No Transfusion: 2u PRBC {Transfusion received:30440034}  Physical exam  Vitals:   01/03/22 1235 01/03/22 1340 01/03/22 1400 01/03/22 1435  BP: 100/61 (!) 92/52 (!) 87/63 111/63  Pulse: 87 89 (!) 105 80  Resp:      Temp:      TempSrc:      SpO2:      Weight:      Height:       General: {Exam; general:21111117} Lochia: {Desc;  appropriate/inappropriate:30686::"appropriate"} Uterine Fundus: {Desc; firm/soft:30687} Incision: {Exam; incision:21111123} DVT Evaluation: {Exam; dvt:2111122} Labs: Lab Results  Component Value Date   WBC 8.1 01/02/2022   HGB 11.7 (L) 01/02/2022   HCT 33.7 (L) 01/02/2022   MCV 84.5 01/02/2022   PLT 231 01/02/2022      Latest Ref Rng & Units 01/02/2022   12:45 AM  CMP  Glucose 70 - 99 mg/dL 96   BUN 6 - 20 mg/dL 5   Creatinine 0.44 - 1.00 mg/dL 0.57   Sodium 135 - 145 mmol/L 137   Potassium 3.5 - 5.1 mmol/L 3.8   Chloride 98 - 111 mmol/L 109   CO2 22 - 32 mmol/L 19   Calcium 8.9 - 10.3 mg/dL 9.5   Total Protein 6.5 - 8.1 g/dL 6.3   Total Bilirubin 0.3 - 1.2 mg/dL 0.4   Alkaline Phos 38 - 126 U/L 122   AST 15 - 41 U/L 18   ALT 0 - 44 U/L 13    Edinburgh Score:    05/09/2019    8:29 AM  Edinburgh Postnatal Depression Scale Screening Tool  I have been able to laugh and see the funny side of things. 0  I have looked forward with enjoyment to things. 0  I have blamed myself unnecessarily when things went wrong. 1  I have been anxious or worried for no good reason. 1  I have felt scared or panicky for  no good reason. 1  Things have been getting on top of me. 1  I have been so unhappy that I have had difficulty sleeping. 1  I have felt sad or miserable. 0  I have been so unhappy that I have been crying. 1  The thought of harming myself has occurred to me. 0  Edinburgh Postnatal Depression Scale Total 6     After visit meds:  Allergies as of 01/03/2022   No Known Allergies   Med Rec must be completed prior to using this [facility]***        Discharge home in stable condition Infant Feeding: {Baby feeding:23562} Infant Disposition:{CHL IP OB HOME WITH CHVGWG:65207} Discharge instruction: per After Visit Summary and Postpartum booklet. Activity: Advance as tolerated. Pelvic rest for 6 weeks.  Diet: {OB OLNT:55027142} Future Appointments: Future Appointments  Date  Time Provider [place]  01/06/2022  8:10 AM Radene Gunning, MD [facility]  01/10/2022  6:30 AM MC-LD [place] MC-INDC None  01/13/2022  7:30 AM [facility] NURSE [facility]  01/13/2022  7:45 AM [facility] US4 [facility]  01/14/2022  8:10 AM Rasch, Artist Pais, NP [facility]  01/21/2022  8:50 AM Tresea Mall, CNM [facility]   Follow up Visit: Roma Schanz, CNM  P [facility] [place] Support Pool Please schedule this patient for PP visit in: bp check 1wk, then 4-6wk pp visit  High risk pregnancy complicated by: [facility]  Delivery mode:  SVD complicated by retained placenta and D&C in OR  Anticipated Birth Control:  IUD at pp visit  PP Procedures needed: BP check  Schedule Integrated [place] visit: no  Provider: Any provider   01/03/2022 Roma Schanz, CNM

## 2022-01-04 LAB — TYPE AND SCREEN
ABO/RH(D): O POS
Antibody Screen: NEGATIVE
Unit division: 0
Unit division: 0

## 2022-01-04 LAB — CBC
HCT: 31.8 % — ABNORMAL LOW (ref 36.0–46.0)
Hemoglobin: 11 g/dL — ABNORMAL LOW (ref 12.0–15.0)
MCH: 28.9 pg (ref 26.0–34.0)
MCHC: 34.6 g/dL (ref 30.0–36.0)
MCV: 83.5 fL (ref 80.0–100.0)
Platelets: 215 10*3/uL (ref 150–400)
RBC: 3.81 MIL/uL — ABNORMAL LOW (ref 3.87–5.11)
RDW: 13.7 % (ref 11.5–15.5)
WBC: 11.8 10*3/uL — ABNORMAL HIGH (ref 4.0–10.5)
nRBC: 0 % (ref 0.0–0.2)

## 2022-01-04 LAB — BPAM RBC
Blood Product Expiration Date: 202310012359
Blood Product Expiration Date: 202310012359
ISSUE DATE / TIME: 202309041514
ISSUE DATE / TIME: 202309041514
Unit Type and Rh: 5100
Unit Type and Rh: 5100

## 2022-01-04 NOTE — Lactation Note (Signed)
This note was copied from a baby's chart. Lactation Consultation Note  Patient Name: Rita Stokes AQTMA'U Date: 01/04/2022 Reason for consult: Initial assessment Age:37 hours  P2, Mother has chosen to breastfeed and pump. She will call for help with pumping (set up) or latching when baby wakes.  Fitted mother for 21 mm flange on Left and 24 mm flange of right with manual pump.  Mother stated she struggled with latching first child due to her inverted nipple on left.  Left side now inverts well with stimulation. Feed on demand with cues.  Goal 8-12+ times per day after first 24 hrs.  Place baby STS if not cueing.  Mom made aware of O/P services, breastfeeding support group, and our phone # for post-discharge questions.    Maternal Data Has patient been taught Hand Expression?: Yes Does the patient have breastfeeding experience prior to this delivery?: Yes How long did the patient breastfeed?:  (a few months/difficult latch and pumped)  Feeding Mother's Current Feeding Choice: Breast Milk   Lactation Tools Discussed/Used Tools: Pump;Flanges Breast pump type: Manual Pump Education: Milk Storage;Setup, frequency, and cleaning Reason for Pumping: mother's choice  Interventions Interventions: Education;Hand pump;Hand express  Discharge Pump: Hands Free [facility])  Consult Status Consult Status: Follow-up Date: 01/04/22 Follow-up type: In-patient    Dahlia Byes [facility] 01/04/2022, 8:46 AM

## 2022-01-04 NOTE — Progress Notes (Addendum)
POSTPARTUM PROGRESS NOTE  Post Partum Day #1  Subjective:  Rita Stokes is a 36 y.o. [id number] s/p SVD at [redacted]w[redacted]d.  She reports she is doing well. No acute events overnight. She denies any problems with ambulating, voiding or po intake. Denies nausea or vomiting.  Pain is well controlled.  Lochia is appropriate .  Objective: Blood pressure 131/63, pulse 90, temperature 98 F (36.7 C), resp. rate 18, height 5\' 8"  (1.727 m), weight 128.1 kg, last menstrual period 04/20/2021, SpO2 100 %, unknown if currently breastfeeding.  Physical Exam:  General: alert, cooperative and no distress Chest: no respiratory distress Heart:regular rate, distal pulses intact Abdomen: soft, nontender,  Uterine Fundus: firm, appropriately tender DVT Evaluation: No calf swelling or tenderness Extremities: No edema Skin: warm, dry  Recent Labs    01/03/22 1632 01/04/22 0457  HGB 12.3 11.0*  HCT 36.7 31.8*    Assessment/Plan: Rita Stokes is a 36 y.o. 30 s/p SVD with retained membranes requiring D&C at [redacted]w[redacted]d   PPD#1 - Doing well Routine postpartum care S/P D&C for retained membranes requiring 2 units pRBCs. Post transfusion Hgb 12.3. Hbg 11 this morning.  Continue cefotan q12 hours for 2 doses. Patient was afebrile overnight.  gHTN - Patient has remained normotensive Contraception: OP IUD (still undecided) Feeding: Both Dispo: Plan for discharge 01/05/22.   LOS: 2 days   03/07/22, MD Resident Physician 01/04/2022, 6:18 AM   GME ATTESTATION:  I saw and evaluated the patient. I agree with the findings and the plan of care as documented in the resident's note. I have made changes to documentation as necessary.  03/06/2022, DO OB Fellow, Faculty [facility], Center for [facility] 01/04/2022, 6:25 AM

## 2022-01-04 NOTE — Anesthesia Postprocedure Evaluation (Signed)
Anesthesia Post Note  Patient: Rita Stokes  Procedure(s) Performed: AN AD HOC LABOR EPIDURAL     Patient location during evaluation: Mother Baby Anesthesia Type: Epidural Level of consciousness: awake and alert Pain management: pain level controlled Vital Signs Assessment: post-procedure vital signs reviewed and stable Respiratory status: spontaneous breathing, nonlabored ventilation and respiratory function stable Cardiovascular status: stable Postop Assessment: no headache, no backache and epidural receding Anesthetic complications: no   No notable events documented.  Last Vitals:  Vitals:   01/04/22 0230 01/04/22 0600  BP: 129/81 131/63  Pulse: 90 90  Resp: 18 18  Temp: 36.7 C 36.7 C  SpO2: 100% 100%    Last Pain:  Vitals:   01/04/22 0100  TempSrc:   PainSc: 0-No pain   Pain Goal:                   [employer]

## 2022-01-04 NOTE — Social Work (Signed)
MOB was referred for history of anxiety.  * Referral screened out by Clinical Social Worker because none of the following criteria appear to apply: ~ History of anxiety/depression during this pregnancy, or of post-partum depression following prior delivery. ~ Diagnosis of anxiety and/or depression within last 3 years OR * MOB's symptoms currently being treated with medication and/or therapy. Per chart review, Anxiety during pregnancy on Zoloft, followed by Psych reports improvement since quit job.   Please contact the Clinical Social Worker if needs arise, by [facility] request, or if MOB scores greater than 9/yes to question 10 on Edinburgh Postpartum Depression Screen.   Vivi Barrack, MSW, LCSW [facility]  Clinical Social Worker  [phone] 01/04/2022  9:26 AM

## 2022-01-05 ENCOUNTER — Other Ambulatory Visit (HOSPITAL_COMMUNITY): Payer: Self-pay

## 2022-01-05 LAB — SURGICAL PATHOLOGY

## 2022-01-05 MED ORDER — FUROSEMIDE 20 MG PO TABS
20.0000 mg | ORAL_TABLET | Freq: Every day | ORAL | 0 refills | Status: DC
  Filled 2022-01-05: qty 3, 3d supply, fill #0

## 2022-01-05 MED ORDER — FERROUS SULFATE 325 (65 FE) MG PO TABS
325.0000 mg | ORAL_TABLET | ORAL | 3 refills | Status: DC
  Filled 2022-01-05: qty 30, 60d supply, fill #0

## 2022-01-05 MED ORDER — ACETAMINOPHEN 325 MG PO TABS
650.0000 mg | ORAL_TABLET | Freq: Four times a day (QID) | ORAL | 0 refills | Status: DC | PRN
  Filled 2022-01-05: qty 30, 4d supply, fill #0

## 2022-01-05 MED ORDER — IBUPROFEN 600 MG PO TABS
600.0000 mg | ORAL_TABLET | Freq: Four times a day (QID) | ORAL | 0 refills | Status: DC
  Filled 2022-01-05: qty 30, 8d supply, fill #0

## 2022-01-06 ENCOUNTER — Encounter: Payer: BC Managed Care – PPO | Admitting: Obstetrics and Gynecology

## 2022-01-07 ENCOUNTER — Inpatient Hospital Stay (HOSPITAL_COMMUNITY)
Admission: AD | Admit: 2022-01-07 | Discharge: 2022-01-07 | Disposition: A | Discharge status: Home / Self Care | Payer: BC Managed Care – PPO | Attending: Obstetrics and Gynecology | Admitting: Obstetrics and Gynecology

## 2022-01-07 ENCOUNTER — Encounter (HOSPITAL_COMMUNITY): Payer: Self-pay | Admitting: Obstetrics and Gynecology

## 2022-01-07 DIAGNOSIS — O165 Unspecified maternal hypertension, complicating the puerperium: Secondary | ICD-10-CM

## 2022-01-07 DIAGNOSIS — O135 Gestational [pregnancy-induced] hypertension without significant proteinuria, complicating the puerperium: Secondary | ICD-10-CM | POA: Diagnosis present

## 2022-01-07 LAB — COMPREHENSIVE METABOLIC PANEL
ALT: 26 U/L (ref 0–44)
AST: 28 U/L (ref 15–41)
Albumin: 2.5 g/dL — ABNORMAL LOW (ref 3.5–5.0)
Alkaline Phosphatase: 92 U/L (ref 38–126)
Anion gap: 9 (ref 5–15)
BUN: 11 mg/dL (ref 6–20)
CO2: 23 mmol/L (ref 22–32)
Calcium: 9.5 mg/dL (ref 8.9–10.3)
Chloride: 109 mmol/L (ref 98–111)
Creatinine, Ser: 0.82 mg/dL (ref 0.44–1.00)
GFR, Estimated: >60 mL/min (ref >60)
Glucose, Bld: 97 mg/dL (ref 70–99)
Potassium: 3.9 mmol/L (ref 3.5–5.1)
Sodium: 141 mmol/L (ref 135–145)
Total Bilirubin: 0.4 mg/dL (ref 0.3–1.2)
Total Protein: 5.8 g/dL — ABNORMAL LOW (ref 6.5–8.1)

## 2022-01-07 LAB — CBC
HCT: 27.6 % — ABNORMAL LOW (ref 36.0–46.0)
Hemoglobin: 9.2 g/dL — ABNORMAL LOW (ref 12.0–15.0)
MCH: 28.9 pg (ref 26.0–34.0)
MCHC: 33.3 g/dL (ref 30.0–36.0)
MCV: 86.8 fL (ref 80.0–100.0)
Platelets: 318 10*3/uL (ref 150–400)
RBC: 3.18 MIL/uL — ABNORMAL LOW (ref 3.87–5.11)
RDW: 13.9 % (ref 11.5–15.5)
WBC: 10 10*3/uL (ref 4.0–10.5)
nRBC: 0.3 % — ABNORMAL HIGH (ref 0.0–0.2)

## 2022-01-07 MED ORDER — FUROSEMIDE 20 MG PO TABS: 20.0000 mg | ORAL_TABLET | Freq: Every day | ORAL | 0 refills | Status: DC

## 2022-01-07 MED ORDER — NIFEDIPINE ER 30 MG PO TB24
30.0000 mg | ORAL_TABLET | Freq: Every day | ORAL | 2 refills | Status: DC
Start: 2022-01-08 — End: 2022-03-08

## 2022-01-07 MED ORDER — NIFEDIPINE ER OSMOTIC RELEASE 30 MG PO TB24
30.0000 mg | ORAL_TABLET | Freq: Every day | ORAL | Status: DC
Start: 2022-01-07 — End: 2022-01-08
  Administered 2022-01-07: 30 mg via ORAL
  Filled 2022-01-07: qty 1

## 2022-01-07 NOTE — MAU Provider Note (Signed)
History     CSN: [id number]  Arrival date and time: 01/07/22 1919   Event Date/Time   First Provider Initiated Contact with Patient 01/07/22 1954      Chief Complaint  Patient presents with   Hypertension   HPI  Rita Stokes is a 36 y.o. G2P2002 at [redacted]w[redacted]d who presents for evaluation of elevated blood pressures. Patient reports she was feeling nauseous and tired this evening so she checked her BP at home and it was elevated. She was induced for gHTN but was normotensive at discharge so she was only sent home on Lasix. She denies any HA, visual changes or epigastric pain. She reports she is breast feeding and that is going well. She feels like her swelling is improving.    OB History     Gravida  2   Para  2   Term  2   Preterm      AB      Living  2      SAB      IAB      Ectopic      Multiple  0   Live Births  2           Past Medical History:  Diagnosis Date   Anxiety    History of gestational hypertension    History of postpartum hemorrhage, currently pregnant    Morbid obesity (HCC)     Past Surgical History:  Procedure Laterality Date   CYSTECTOMY     DILATION AND CURETTAGE OF UTERUS N/A 01/03/2022   Procedure: DILATATION AND CURETTAGE;  Surgeon: Tereso Newcomer, MD;  Location: [facility]S;  Service: Gynecology;  Laterality: N/A;   WISDOM TOOTH EXTRACTION      Family History  Problem Relation Age of Onset   Arthritis Mother    Arthritis/Rheumatoid Mother    Macular degeneration Mother     Social History   Tobacco Use   Smoking status: Never   Smokeless tobacco: Never  Vaping Use   Vaping Use: Never used  Substance Use Topics   Alcohol use: Never   Drug use: Never    Allergies: No Known Allergies  Medications Prior to Admission  Medication Sig Dispense Refill Last Dose   cetirizine (ZYRTEC) 10 MG tablet Take 10 mg by mouth daily.   01/07/2022   docusate sodium (COLACE) 100 MG capsule Take 100 mg by mouth 2 (two) times daily.    01/07/2022   ferrous sulfate 325 (65 FE) MG tablet Take 1 tablet (325 mg total) by mouth every other day. 30 tablet 3 01/06/2022   ibuprofen (ADVIL) 600 MG tablet Take 1 tablet (600 mg total) by mouth every 6 (six) hours. 30 tablet 0 01/07/2022   Prenatal Vit-Fe Fumarate-FA (PRENATAL MULTIVITAMIN) TABS tablet Take 1 tablet by mouth daily at 12 noon.   01/07/2022   [DISCONTINUED] furosemide (LASIX) 20 MG tablet Take 1 tablet (20 mg total) by mouth daily for 3 days. 3 tablet 0 01/07/2022   acetaminophen (TYLENOL) 325 MG tablet Take 2 tablets (650 mg total) by mouth every 6 (six) hours as needed. 30 tablet 0    calcium carbonate (TUMS - DOSED IN MG ELEMENTAL CALCIUM) 500 MG chewable tablet Chew 1 tablet by mouth daily as needed for indigestion or heartburn.       Review of Systems  Constitutional:  Positive for fatigue. Negative for fever.  HENT: Negative.    Respiratory: Negative.  Negative for shortness of breath.   Cardiovascular:  Negative.  Negative for chest pain.  Gastrointestinal: Negative.  Negative for abdominal pain, constipation, diarrhea, nausea and vomiting.  Genitourinary: Negative.  Negative for dysuria, vaginal bleeding and vaginal discharge.  Neurological: Negative.  Negative for dizziness and headaches.   Physical Exam   Blood pressure (!) 140/84, pulse 84, temperature (!) 97.3 F (36.3 C), temperature source Oral, resp. rate 20, height 5\' 8"  (1.727 m), weight 122.2 kg, last menstrual period 04/20/2021, SpO2 99 %, unknown if currently breastfeeding.  Patient Vitals for the past 24 hrs:  BP Temp Temp src Pulse Resp SpO2 Height Weight  01/07/22 2031 (!) 140/84 -- -- 84 -- -- -- --  01/07/22 2030 -- -- -- -- -- 99 % -- --  01/07/22 2025 -- -- -- -- -- 99 % -- --  01/07/22 2020 -- -- -- -- -- 99 % -- --  01/07/22 2016 (!) 142/86 -- -- 89 -- -- -- --  01/07/22 2015 -- -- -- -- -- 99 % -- --  01/07/22 2010 -- -- -- -- -- 99 % -- --  01/07/22 2005 -- -- -- -- -- 99 % -- --  01/07/22  2001 (!) 148/86 -- -- 89 -- -- -- --  01/07/22 2000 -- -- -- -- -- 99 % -- --  01/07/22 1955 -- -- -- -- -- 99 % -- --  01/07/22 1950 -- -- -- -- -- 99 % -- --  01/07/22 1949 (!) 141/91 -- -- 92 -- -- -- --  01/07/22 1939 (!) 148/85 (!) 97.3 F (36.3 C) Oral 92 20 -- 5\' 8"  (1.727 m) 122.2 kg    Physical Exam Vitals and nursing note reviewed.  Constitutional:      General: She is not in acute distress.    Appearance: She is well-developed.  HENT:     Head: Normocephalic.  Eyes:     Pupils: Pupils are equal, round, and reactive to light.  Cardiovascular:     Rate and Rhythm: Normal rate and regular rhythm.     Heart sounds: Normal heart sounds.  Pulmonary:     Effort: Pulmonary effort is normal. No respiratory distress.     Breath sounds: Normal breath sounds.  Abdominal:     General: Bowel sounds are normal. There is no distension.     Palpations: Abdomen is soft.     Tenderness: There is no abdominal tenderness.  Skin:    General: Skin is warm and dry.  Neurological:     Mental Status: She is alert and oriented to person, place, and time.  Psychiatric:        Mood and Affect: Mood normal.        Behavior: Behavior normal.        Thought Content: Thought content normal.        Judgment: Judgment normal.     MAU Course  Procedures  Results for orders placed or performed during the hospital encounter of 01/07/22 (from the past 24 hour(s))  CBC     Status: Abnormal   Collection Time: 01/07/22  7:55 PM  Result Value Ref Range   WBC 10.0 4.0 - 10.5 K/uL   RBC 3.18 (L) 3.87 - 5.11 MIL/uL   Hemoglobin 9.2 (L) 12.0 - 15.0 g/dL   HCT 03/09/22 (L) 03/09/22 - 48.8 %   MCV 86.8 80.0 - 100.0 fL   MCH 28.9 26.0 - 34.0 pg   MCHC 33.3 30.0 - 36.0 g/dL   RDW 89.1 69.4 - 50.3 %  Platelets 318 150 - 400 K/uL   nRBC 0.3 (H) 0.0 - 0.2 %  Comprehensive metabolic panel     Status: Abnormal   Collection Time: 01/07/22  7:55 PM  Result Value Ref Range   Sodium 141 135 - 145 mmol/L    Potassium 3.9 3.5 - 5.1 mmol/L   Chloride 109 98 - 111 mmol/L   CO2 23 22 - 32 mmol/L   Glucose, Bld 97 70 - 99 mg/dL   BUN 11 6 - 20 mg/dL   Creatinine, Ser 3.38 0.44 - 1.00 mg/dL   Calcium 9.5 8.9 - 25.0 mg/dL   Total Protein 5.8 (L) 6.5 - 8.1 g/dL   Albumin 2.5 (L) 3.5 - 5.0 g/dL   AST 28 15 - 41 U/L   ALT 26 0 - 44 U/L   Alkaline Phosphatase 92 38 - 126 U/L   Total Bilirubin 0.4 0.3 - 1.2 mg/dL   GFR, Estimated >53 >97 mL/min   Anion gap 9 5 - 15     MDM Labs ordered and reviewed.   CBC, CMP Procardia PO  Reviewed results with Dr. Alysia Penna- ok to discharge home with follow up on Monday as scheduled   Assessment and Plan   1. Postpartum hypertension    -Discharge home in stable condition -Rx for procardia and lasix sent to patient's pharmacy -Preeclampsia precautions discussed -Patient advised to follow-up with OB as scheduled on Monday for BP check -Patient may return to MAU as needed or if her condition were to change or worsen  Rolm Bookbinder, CNM 01/07/2022, 8:44 PM

## 2022-01-07 NOTE — MAU Note (Signed)
Pt says she del vag on  Mon 01-03-2022 [facility] home on Wed 01-05-2022 Was admitted for high BP After admission  - BP was NL  Was NL when she left home- no meds at home for BP At 6pm- was feeling tired and nausea - so checked BP- 166/103, 159/94 No H/A, No vision changes , no epigastric pain  Breast feeding

## 2022-01-10 ENCOUNTER — Inpatient Hospital Stay (HOSPITAL_COMMUNITY): Payer: BC Managed Care – PPO

## 2022-01-10 ENCOUNTER — Inpatient Hospital Stay (HOSPITAL_COMMUNITY)
Admission: AD | Admit: 2022-01-10 | Payer: BC Managed Care – PPO | Source: Home / Self Care | Admitting: Obstetrics and Gynecology

## 2022-01-10 ENCOUNTER — Encounter: Payer: Self-pay | Admitting: *Deleted

## 2022-01-10 ENCOUNTER — Ambulatory Visit (INDEPENDENT_AMBULATORY_CARE_PROVIDER_SITE_OTHER): Payer: BC Managed Care – PPO | Admitting: *Deleted

## 2022-01-10 VITALS — BP 126/62 | HR 98 | Wt 255.0 lb

## 2022-01-10 DIAGNOSIS — O165 Unspecified maternal hypertension, complicating the puerperium: Secondary | ICD-10-CM

## 2022-01-10 NOTE — Progress Notes (Cosign Needed)
Pt here for 1 week BP check after delivery.  She is currently on  BP med and Lasix.  BP today is 126/62, P-98.  No swelling noted in feet or hands.  Pt states she feels good.  She will continue her BP meds until her PP visit at which time it will be addressed.  Pt encouraged to call if any issues prior to PP visit.

## 2022-01-13 ENCOUNTER — Ambulatory Visit: Payer: BC Managed Care – PPO

## 2022-01-13 ENCOUNTER — Other Ambulatory Visit: Payer: BC Managed Care – PPO

## 2022-01-14 ENCOUNTER — Encounter: Payer: BC Managed Care – PPO | Admitting: Obstetrics and Gynecology

## 2022-01-21 ENCOUNTER — Encounter: Payer: BC Managed Care – PPO | Admitting: Advanced Practice Midwife

## 2022-02-07 NOTE — Progress Notes (Unsigned)
Post Partum Visit Note  Rita Stokes is a 36 y.o. G72P2002 female who presents for a postpartum visit. She is 4 weeks postpartum following a normal spontaneous vaginal delivery.  I have fully reviewed the prenatal and intrapartum course. The delivery was at 38.0 gestational weeks.  Anesthesia: epidural. Postpartum course has been unremarkable. Baby is doing well. Baby is feeding by breast. Bleeding no bleeding. Bowel function is normal. Bladder function is normal. Patient is not sexually active. Contraception method is IUD. Postpartum depression screening: negative- score 8 .   The pregnancy intention screening data noted above was reviewed. Potential methods of contraception were discussed. The patient elected to proceed with No data recorded.   Edinburgh Postnatal Depression Scale - 02/08/22 1308       Edinburgh Postnatal Depression Scale:  In the Past 7 Days   I have been able to laugh and see the funny side of things. 1    I have looked forward with enjoyment to things. 1    I have blamed myself unnecessarily when things went wrong. 1    I have been anxious or worried for no good reason. 2    I have felt scared or panicky for no good reason. 0    Things have been getting on top of me. 0    I have been so unhappy that I have had difficulty sleeping. 0    I have felt sad or miserable. 1    I have been so unhappy that I have been crying. 1    The thought of harming myself has occurred to me. 1    Edinburgh Postnatal Depression Scale Total 8             Health Maintenance Due  Topic Date Due   COVID-19 Vaccine (1) Never done   INFLUENZA VACCINE  11/30/2021    The following portions of the patient's history were reviewed and updated as appropriate: allergies, current medications, past family history, past medical history, past social history, past surgical history, and problem list.  Review of Systems Pertinent items are noted in HPI.  Objective:  BP 96/60   Pulse 88    Ht 5\' 8"  (1.727 m)   Wt 249 lb (112.9 kg)   LMP 04/20/2021   BMI 37.86 kg/m    General:  alert and no distress   Breasts:  not indicated  Lungs: Effort normal  Heart:  Regular rate  Abdomen: soft, non-tender; bowel sounds normal; no masses,  no organomegaly   Wound N/a  GU exam:  normal        IUD Procedure Note Patient identified, informed consent performed.  Discussed risks of irregular bleeding, cramping, infection, malpositioning or misplacement of the IUD outside the uterus which may require further procedures. Time out was performed.  Urine pregnancy test negative.  Speculum placed in the vagina. Cervix visualized. Cleaned with Betadine x 2.  Grasped anteriorly with a single tooth tenaculum. Uterus sounded to 6 cm. Mirena IUD placed per manufacturer's recommendations. Strings trimmed to 3 cm.Tenaculum was removed, good hemostasis noted. Patient tolerated procedure well.   Patient was given post-procedure instructions and the Mirena care card with expiration date. Patient was also asked to check IUD strings periodically and follow up in 4-6 weeks for IUD check.  Assessment:   1. Postpartum care and examination - Normal PP exam - Patient desires Mirena IUD. Placed today without difficulty. Patient to return in 4 weeks for IUD string check  2. Pre-procedure  lab exam - UPT negative  - POCT urine pregnancy  3. Encounter for IUD insertion  - levonorgestrel (MIRENA) 20 MCG/DAY IUD 1 each    Plan:   Essential components of care per ACOG recommendations:  1.  Mood and well being: Patient with negative depression screening today. Reviewed local resources for support.  - Patient tobacco use? No.   - hx of drug use? No.    2. Infant care and feeding:  -Patient currently breastmilk feeding? Yes. Discussed returning to work and pumping. Reviewed importance of draining breast regularly to support lactation.  -Social determinants of health (SDOH) reviewed in EPIC. No  concerns  3. Sexuality, contraception and birth spacing - Patient does not want a pregnancy in the next year.  Desired family size is 2 children.  - Reviewed reproductive life planning. Reviewed contraceptive methods based on pt preferences and effectiveness.  Patient desired IUD or IUS today.   - Discussed birth spacing of 18 months  4. Sleep and fatigue -Encouraged family/partner/community support of 4 hrs of uninterrupted sleep to help with mood and fatigue  5. Physical Recovery  - Discussed patients delivery and complications. She describes her labor as mixed. - Patient had a Vaginal problems after delivery including postpartum hemorrhage requring D&E . Patient had a 1st degree laceration. Perineal healing reviewed. Patient expressed understanding - Patient has urinary incontinence? No. - Patient is safe to resume physical and sexual activity after 5 days since IUD placed today  6.  Health Maintenance - HM due items addressed Yes - Last pap smear  Diagnosis  Date Value Ref Range Status  03/30/2021   Final   - Negative for intraepithelial lesion or malignancy (NILM)   Pap smear not done at today's visit.  -Breast Cancer screening indicated? No.   7. Chronic Disease/Pregnancy Condition follow up: None   Renee Harder, [place] for [employer], [place]

## 2022-02-08 ENCOUNTER — Ambulatory Visit (INDEPENDENT_AMBULATORY_CARE_PROVIDER_SITE_OTHER): Payer: BC Managed Care – PPO

## 2022-02-08 VITALS — BP 96/60 | HR 88 | Ht 68.0 in | Wt 249.0 lb

## 2022-02-08 DIAGNOSIS — Z30430 Encounter for insertion of intrauterine contraceptive device: Secondary | ICD-10-CM | POA: Diagnosis not present

## 2022-02-08 DIAGNOSIS — Z01812 Encounter for preprocedural laboratory examination: Secondary | ICD-10-CM

## 2022-02-08 LAB — POCT URINE PREGNANCY: Preg Test, Ur: NEGATIVE

## 2022-02-08 MED ORDER — LEVONORGESTREL 20 MCG/DAY IU IUD
1.0000 | INTRAUTERINE_SYSTEM | Freq: Once | INTRAUTERINE | Status: AC
  Administered 2022-02-08: 1 via INTRAUTERINE

## 2022-03-08 ENCOUNTER — Ambulatory Visit (INDEPENDENT_AMBULATORY_CARE_PROVIDER_SITE_OTHER): Payer: BC Managed Care – PPO

## 2022-03-08 VITALS — BP 126/80 | HR 79 | Ht 68.0 in | Wt 247.0 lb

## 2022-03-08 DIAGNOSIS — Z30431 Encounter for routine checking of intrauterine contraceptive device: Secondary | ICD-10-CM

## 2022-03-08 NOTE — Progress Notes (Signed)
   GYNECOLOGY CLINIC PROGRESS NOTE  History:  36 y.o. [id number] here at [facility] today for today for IUD string check; Mirena IUD was placed  02/08/2022. No complaints about the IUD, no concerning side effects.  The following portions of the patient's history were reviewed and updated as appropriate: allergies, current medications, past family history, past medical history, past social history, past surgical history and problem list. Last pap smear on 03/30/2021 was normal, negative HRHPV.  Review of Systems:  Pertinent items are noted in HPI.   Objective:  Physical Exam Blood pressure 126/80, pulse 79, height 5\' 8"  (1.727 m), weight 247 lb (112 kg), last menstrual period 04/20/2021, currently breastfeeding. Gen: NAD Abd: Soft, nontender and nondistended Pelvic: Normal appearing external genitalia; normal appearing vaginal mucosa and cervix. IUD strings visualized, about 3 cm in length outside cervix.   Assessment & Plan:  1. IUD check up Normal IUD check. Patient to keep IUD in place for eight years; can come in for removal if she desires pregnancy within the next eight years. Routine preventative health maintenance measures emphasized.   Renee Harder, CNM 9:28 AM

## 2022-03-22 ENCOUNTER — Encounter: Payer: Self-pay | Admitting: Internal Medicine

## 2022-03-22 ENCOUNTER — Other Ambulatory Visit: Payer: Self-pay

## 2022-03-22 ENCOUNTER — Ambulatory Visit (INDEPENDENT_AMBULATORY_CARE_PROVIDER_SITE_OTHER): Payer: BC Managed Care – PPO | Admitting: Internal Medicine

## 2022-03-22 VITALS — BP 124/68 | HR 82 | Temp 98.7°F | Resp 20 | Ht 67.25 in | Wt 247.6 lb

## 2022-03-22 DIAGNOSIS — J3089 Other allergic rhinitis: Secondary | ICD-10-CM

## 2022-03-22 DIAGNOSIS — J302 Other seasonal allergic rhinitis: Secondary | ICD-10-CM

## 2022-03-22 DIAGNOSIS — H1045 Other chronic allergic conjunctivitis: Secondary | ICD-10-CM

## 2022-03-22 MED ORDER — MONTELUKAST SODIUM 10 MG PO TABS: 10.0000 mg | ORAL_TABLET | Freq: Every day | ORAL | 1 refills | Status: DC

## 2022-03-22 MED ORDER — EPINEPHRINE 0.3 MG/0.3ML IJ SOAJ: 0.3000 mg | INTRAMUSCULAR | 1 refills | Status: AC | PRN

## 2022-03-22 MED ORDER — AZELASTINE HCL 0.1 % NA SOLN
2.0000 | Freq: Two times a day (BID) | NASAL | 6 refills | Status: DC
Start: 2022-03-22 — End: 2022-09-20

## 2022-03-22 NOTE — Progress Notes (Signed)
New Patient Note  RE: Rita Stokes MRN: [id number] DOB: 1986/03/12 Date of Office Visit: 03/22/2022  Consult requested by: Josephina Gip, NP Primary care provider: Josephina Gip, NP  Chief Complaint: Establish Care and Allergy Testing  History of Present Illness: I had the pleasure of seeing Rita Stokes for initial evaluation [facility]  on 03/22/2022. She is a 36 y.o. female, who is referred here by Josephina Gip, NP for the evaluation of allergic rhinitis .  History obtained from patient, chart review.  Chronic rhinitis: started as a [place] child  Symptoms include:  recurrent sinus infections, nasal congestion, rhinorrhea, post nasal drainage, sneezing, watery eyes, itchy eyes, and itchy nose, cough  Occurs year-round will get sinus infections with tempeture changes, molds  Potential triggers: dogs,  Treatments tried: previously on allergy injections for a year a half around 2019 with allergy partners extracts then Lebaurer) (good benefit), flonase and zyrtec (will have break through symptoms  Previous allergy testing: yes  History of reflux/heartburn: no History of chronic sinusitis or sinus surgery: no Nonallergic triggers:  denies     Reports a history of vomiting with any meat during pregnancy, other denies food reactions. She is interested in food allergy testing.    Reviewed previous allergy testing and he was previously positive to [employer], mugwort, ash, birch, red cedar, Cottonwood, oak, Sycamore, walnut, Cladosporium, Aspergillus, BiPolaris,  Pullulara, Epicoccum, roach  -These had turned negative on today's  skin test   Assessment and Plan: Rita Stokes is a 36 y.o. female with: Seasonal and perennial allergic rhinitis - Plan: Allergy Test  Other chronic allergic conjunctivitis of both eyes - Plan: Allergy Test Plan: Patient Instructions  Seasonal and Perennial Allergic Rhinitis not well controlled : - allergy testing today was  positive to grass pollen, weed pollen, tree pollen, mold, dust mite, dog  -Negative to common foods  -Previous testing showed positivity to roach as well - allergen avoidance as below Medical therapy:  - Start Nasal Steroid Spray: Options include Flonase (fluticasone), Nasocort (triamcinolone), Nasonex (mometasome) 1- 2 sprays in each nostril daily (can buy over-the-counter if not covered by insurance)  Best results if used daily. - Start Astelin (Azelastine) 1-2 sprays in each nostril twice a day as needed.  You may use this as needed for nasal congestion/itchy ears/itchy nose if desired - Start Singulair (Montelukast)  nightly. - Continue over the counter antihistamine daily or daily as needed.   -Your options include Zyrtec (Cetirizine) , Claritin (Loratadine) , Allegra (Fexofenadine) , or Xyzal (Levocetirinze)   Allergic Conjunctivitis:  - Consider Allergy Eye drops: great options include Pataday (Olopatadine) or Zaditor (ketotifen) for eye symptoms daily as needed-both sold over the counter if not covered by insurance.   -Avoid eye drops that say red eye relief as they may contain medications that dry out your eyes.  Start allergy injections. Codes given for RUSH build up or Standard build up:  Indications: rapid relief of very poorly controlled allergic rhinitis  Had a detailed discussion with patient/family that clinical history is suggestive of allergic rhinitis, and may benefit from allergy immunotherapy (AIT). Discussed in detail regarding the dosing, schedule, side effects (mild to moderate local allergic reaction and rarely systemic allergic reactions including anaphylaxis/death), alternatives and benefits (significant improvement in nasal symptoms, seasonal flares of asthma) of immunotherapy with the patient. There is significant time commitment involved with allergy shots, which includes weekly immunotherapy injections for first 9-12 months and then biweekly to  monthly injections  for 3-5 years. Clinical response is often delayed and patient may not see an improvement for 6-12 months. Consent was signed. I have prescribed epinephrine injectable and demonstrated proper use. For mild symptoms you can take over the counter antihistamines such as Benadryl and monitor symptoms closely. If symptoms worsen or if you have severe symptoms including breathing issues, throat closure, significant swelling, whole body hives, severe diarrhea and vomiting, lightheadedness then inject epinephrine and seek immediate medical care afterwards. Action plan given.  Follow up: For allergy injection start: RUSH versus standard build up  Follow up in clinic in 6 months   Thank you so much for letting me partake in your care today.  Don't hesitate to reach out if you have any additional concerns!  Ferol LuzEvelyn Rashida Ladouceur, MD  [facility] [place] [place]   Reducing Pollen Exposure  The American Academy of Allergy, Asthma and Immunology suggests the following steps to reduce your exposure to pollen during allergy seasons.    Do not hang sheets or clothing out to dry; pollen may collect on these items. Do not mow lawns or spend time around freshly cut grass; mowing stirs up pollen. Keep windows closed at night.  Keep car windows closed while driving. Minimize morning activities outdoors, a time when pollen counts are usually at their highest. Stay indoors as much as possible when pollen counts or humidity is high and on windy days when pollen tends to remain in the air longer. Use air conditioning when possible.  Many air conditioners have filters that trap the pollen spores. Use a HEPA room air filter to remove pollen form the indoor air you breathe.  DUST MITE AVOIDANCE MEASURES:  There are three main measures that need and can be taken to avoid house dust mites:  Reduce accumulation of dust in general -reduce furniture, clothing, carpeting, books, stuffed animals,  especially in bedroom  Separate yourself from the dust -use pillow and mattress encasements (can be found at stores such as [employer], [employer] or online) -avoid direct exposure to air condition flow -use a HEPA filter device, especially in the bedroom; you can also use a HEPA filter vacuum cleaner -wipe dust with a moist towel instead of a dry towel or broom when cleaning  Decrease mites and/or their secretions -wash clothing and linen and stuffed animals at highest temperature possible, at least every 2 weeks -stuffed animals can also be placed in a bag and put in a freezer overnight  Despite the above measures, it is impossible to eliminate dust mites or their allergen completely from your home.  With the above measures the burden of mites in your home can be diminished, with the goal of minimizing your allergic symptoms.  Success will be reached only when implementing and using all means together.  Control of Dog or Cat Allergen  Avoidance is the best way to manage a dog or cat allergy. If you have a dog or cat and are allergic to dog or cats, consider removing the dog or cat from the home. If you have a dog or cat but don't want to find it a new home, or if your family wants a pet even though someone in the household is allergic, here are some strategies that may help keep symptoms at bay:  Keep the pet out of your bedroom and restrict it to only a few rooms. Be advised that keeping the dog or cat in only one room will not limit the allergens to that  room. Don't pet, hug or kiss the dog or cat; if you do, wash your hands with soap and water. High-efficiency particulate air (HEPA) cleaners run continuously in a bedroom or living room can reduce allergen levels over time. Regular use of a high-efficiency vacuum cleaner or a central vacuum can reduce allergen levels. Giving your dog or cat a bath at least once a week can reduce airborne allergen.  Control of Mold Allergen   Mold and  fungi can grow on a variety of surfaces provided certain temperature and moisture conditions exist.  Outdoor molds grow on plants, decaying vegetation and soil.  The major outdoor mold, Alternaria and Cladosporium, are found in very high numbers during hot and dry conditions.  Generally, a late Summer - Fall peak is seen for common outdoor fungal spores.  Rain will temporarily lower outdoor mold spore count, but counts rise rapidly when the rainy period ends.  The most important indoor molds are Aspergillus and Penicillium.  Dark, humid and poorly ventilated basements are ideal sites for mold growth.  The next most common sites of mold growth are the bathroom and the kitchen.  Outdoor (Seasonal) Mold Control  Positive outdoor molds via skin testing: Alternaria  Use air conditioning and keep windows closed Avoid exposure to decaying vegetation. Avoid leaf raking. Avoid grain handling. Consider wearing a face mask if working in moldy areas.      Meds ordered this encounter  Medications   EPINEPHrine 0.3 mg/0.3 mL IJ SOAJ injection    Sig: Inject 0.3 mg into the muscle as needed for anaphylaxis.    Dispense:  1 each    Refill:  1   montelukast (SINGULAIR) 10 MG tablet    Sig: Take 1 tablet (10 mg total) by mouth at bedtime.    Dispense:  90 tablet    Refill:  1   azelastine (ASTELIN) 0.1 % nasal spray    Sig: Place 2 sprays into both nostrils 2 (two) times daily. Use in each nostril as directed    Dispense:  30 mL    Refill:  6   Lab Orders  No laboratory test(s) ordered today    Other allergy screening: Asthma:  history of EIB as a child  Rhino conjunctivitis: yes Food allergy: no Medication allergy: no Hymenoptera allergy: no Urticaria: no Eczema:no History of recurrent infections suggestive of immunodeficency: no  Diagnostics:  Skin Testing: Environmental allergy panel and select foods. positive to grass pollen, weed pollen, tree pollen, mold, dust mite, dog  -Negative  to common foods Results interpreted by myself and discussed with patient/family.  Airborne Adult Perc - 03/22/22 1035     Time Antigen Placed 1030    Allergen Manufacturer Waynette Buttery    Location Back    Number of Test 58    1. Control-Buffer 50% Glycerol Negative    2. Control-Histamine 1 mg/ml 4+    3. Albumin saline 2+    4. Bahia 3+    5. [place] 3+    6. Johnson Negative    7. Kentucky Blue Negative    9. Perennial Rye 3+    10. Sweet Vernal Negative    11. Timothy 3+    12. Cocklebur 3+    13. Burweed Marshelder 3+    14. Ragweed, short 4+    15. Ragweed, Giant 4+    16. Plantain,  English 3+    17. Lamb's Quarters 3+    18. Sheep Sorrell 3+    19. Rough Pigweed  3+    20. Marsh Elder, Rough 3+    21. Mugwort, Common Negative    22. Ash mix Negative    23. Birch mix Negative    24. Beech American Negative    25. Box, Elder Negative    26. Cedar, red Negative    27. Cottonwood, Guinea[place] Negative    28. Elm mix 3+    29. Hickory 3+    30. Maple mix 3+    31. Oak, [place] mix Negative    32. Pecan Pollen 3+    33. Pine mix 3+    34. Sycamore Eastern Negative    35. Walnut, Black Pollen Negative    36. Alternaria alternata 3+    37. Cladosporium Herbarum Negative    38. Aspergillus mix Negative    39. Penicillium mix 4+    40. Bipolaris sorokiniana (Helminthosporium) Negative    41. Drechslera spicifera (Curvularia) Negative    42. Mucor plumbeus Negative    43. Fusarium moniliforme Negative    44. Aureobasidium pullulans (pullulara) 3+    45. Rhizopus oryzae 3+    46. Botrytis cinera 3+    47. Epicoccum nigrum Negative    48. Phoma betae Negative    49. Candida Albicans 3+    50. Trichophyton mentagrophytes Negative    51. Mite, D Farinae  5,000 AU/ml Negative    52. Mite, D Pteronyssinus  5,000 AU/ml 4+    53. Cat Hair 10,000 BAU/ml Negative    54.  Dog Epithelia 3+    55. Mixed Feathers Negative    56. Horse Epithelia Negative    57. Cockroach, German  Negative    58. Mouse Negative    59. Tobacco Leaf Negative             Food Perc - 03/22/22 1035       Test Information   Time Antigen Placed 1030    Allergen Manufacturer Waynette Buttery    Location Back    Number of allergen test 10    Food Select      Food   1. Peanut Negative    2. Soybean food Negative    3. Wheat, whole Negative    4. Sesame Negative    5. Milk, cow Negative    6. Egg White, chicken Negative    7. Casein Negative    8. Shellfish mix Negative    9. Fish mix Negative    10. Cashew Negative             Past Medical History: Patient Active Problem List   Diagnosis Date Noted   Retained placenta with hemorrhage, postpartum condition 01/03/2022   Gestational hypertension 01/02/2022   Excessive weight gain affecting pregnancy 12/10/2021   Obesity affecting pregnancy 12/10/2021   Pure hypercholesterolemia 11/23/2021   Anxiety during pregnancy 08/20/2021   History of gestational hypertension 06/30/2021   Obesity (BMI 30-39.9) 06/30/2021   Supervision of other normal pregnancy, antepartum 06/25/2021   Rubella non-immune status, antepartum 05/07/2019   Childhood asthma 05/07/2019   Past Medical History:  Diagnosis Date   Anxiety    History of gestational hypertension    History of postpartum hemorrhage, currently pregnant    Morbid obesity (HCC)    Past Surgical History: Past Surgical History:  Procedure Laterality Date   CYSTECTOMY     DILATION AND CURETTAGE OF UTERUS N/A 01/03/2022   Procedure: DILATATION AND CURETTAGE;  Surgeon: Tereso Newcomer, MD;  Location: [facility] LD ORS;  Service:  Gynecology;  Laterality: N/A;   WISDOM TOOTH EXTRACTION     Medication List:  Current Outpatient Medications  Medication Sig Dispense Refill   azelastine (ASTELIN) 0.1 % nasal spray Place 2 sprays into both nostrils 2 (two) times daily. Use in each nostril as directed 30 mL 6   cetirizine (ZYRTEC) 10 MG tablet Take 10 mg by mouth daily.     EPINEPHrine 0.3 mg/0.3  mL IJ SOAJ injection Inject 0.3 mg into the muscle as needed for anaphylaxis. 1 each 1   levonorgestrel (MIRENA) 20 MCG/DAY IUD 1 each by Intrauterine route once.     montelukast (SINGULAIR) 10 MG tablet Take 1 tablet (10 mg total) by mouth at bedtime. 90 tablet 1   Prenatal Vit-Fe Fumarate-FA (PRENATAL MULTIVITAMIN) TABS tablet Take 1 tablet by mouth daily at 12 noon. (Patient not taking: Reported on 03/22/2022)     No current facility-administered medications for this visit.   Allergies: Not on File Social History: Social History   Socioeconomic History   Marital status: Married    Spouse name: Not on file   Number of children: Not on file   Years of education: Not on file   Highest education level: Not on file  Occupational History   Not on file  Tobacco Use   Smoking status: Never   Smokeless tobacco: Never  Vaping Use   Vaping Use: Never used  Substance and Sexual Activity   Alcohol use: Never   Drug use: Never   Sexual activity: Not Currently    Birth control/protection: I.U.D.  Other Topics Concern   Not on file  Social History Narrative   Not on file   Social Determinants of Health   Financial Resource Strain: Not on file  Food Insecurity: Not on file  Transportation Needs: Not on file  Physical Activity: Not on file  Stress: Not on file  Social Connections: Not on file   Lives in a single-family home that is 37 years old.  There are no roaches in the house and bed is 2 feet off the floor.  There are dust mite precautions on bed and pillows.  She is not exposed to fumes, chemicals or dust at her job but is through hobbies.  There is no HEPA filter in the home and [place] not [place] an [place]. Smoking: No exposure Occupation: Occupational hygienist History: Immunologist in the house: no Engineer, civil (consulting) in the family room: no Carpet in the bedroom: yes Heating: heat pump Cooling: heat pump Pet: yes 2 dogs with access to  bedroom  Family History: Family History  Problem Relation Age of Onset   Arthritis Mother    Arthritis/Rheumatoid Mother    Macular degeneration Mother      ROS: All others negative except as noted per HPI.   Objective: BP 124/68 (BP Location: Left Arm, Patient Position: Sitting, Cuff Size: Normal)   Pulse 82   Temp 98.7 F (37.1 C) (Temporal)   Resp 20   Ht 5' 7.25" (1.708 m)   Wt 247 lb 9.6 oz (112.3 kg)   SpO2 99%   BMI 38.49 kg/m  Body mass index is 38.49 kg/m.  General Appearance:  Alert, cooperative, no distress, appears stated age  Head:  Normocephalic, without obvious abnormality, atraumatic  Eyes:  Conjunctiva clear, EOM's intact  Nose: Nares normal,  erythematous nasal mucosa, hypertrophic turbinates, no visible anterior polyps, and septum midline  Throat: Lips, tongue normal; teeth and gums normal, no tonsillar  exudate and + cobblestoning  Neck: Supple, symmetrical  Lungs:   clear to auscultation bilaterally, Respirations unlabored, no coughing  Heart:  regular rate and rhythm and no murmur, Appears well perfused  Extremities: No edema  Skin: Skin color, texture, turgor normal, no rashes or lesions on visualized portions of skin  Neurologic: No gross deficits   The plan was reviewed with the patient/family, and all questions/concerned were addressed.  It was my pleasure to see Rita Stokes today and participate in her care. Please feel free to contact me with any questions or concerns.  Sincerely,  Ferol Luz, MD Allergy & Immunology  [facility] office: [phone] [facility] office: [phone]

## 2022-03-22 NOTE — Patient Instructions (Addendum)
Seasonal and Perennial Allergic Rhinitis not well controlled : - allergy testing today was positive to grass pollen, weed pollen, tree pollen, mold, dust mite, dog  -Negative to common foods  -Previous testing showed positivity to roach as well - allergen avoidance as below Medical therapy:  - Start Nasal Steroid Spray: Options include Flonase (fluticasone), Nasocort (triamcinolone), Nasonex (mometasome) 1- 2 sprays in each nostril daily (can buy over-the-counter if not covered by insurance)  Best results if used daily. - Start Astelin (Azelastine) 1-2 sprays in each nostril twice a day as needed.  You may use this as needed for nasal congestion/itchy ears/itchy nose if desired - Start Singulair (Montelukast) 10mg  nightly. - Continue over the counter antihistamine daily or daily as needed.   -Your options include Zyrtec (Cetirizine) 10mg , Claritin (Loratadine) 10mg , Allegra (Fexofenadine) 180mg , or Xyzal (Levocetirinze) 5mg   Allergic Conjunctivitis:  - Consider Allergy Eye drops: great options include Pataday (Olopatadine) or Zaditor (ketotifen) for eye symptoms daily as needed-both sold over the counter if not covered by insurance.   -Avoid eye drops that say red eye relief as they may contain medications that dry out your eyes.  Start allergy injections. Codes given for RUSH build up or Standard build up:  Indications: rapid relief of very poorly controlled allergic rhinitis  Had a detailed discussion with patient/family that clinical history is suggestive of allergic rhinitis, and may benefit from allergy immunotherapy (AIT). Discussed in detail regarding the dosing, schedule, side effects (mild to moderate local allergic reaction and rarely systemic allergic reactions including anaphylaxis/death), alternatives and benefits (significant improvement in nasal symptoms, seasonal flares of asthma) of immunotherapy with the patient. There is significant time commitment involved with allergy shots,  which includes weekly immunotherapy injections for first 9-12 months and then biweekly to monthly injections for 3-5 years. Clinical response is often delayed and patient may not see an improvement for 6-12 months. Consent was signed. I have prescribed epinephrine injectable and demonstrated proper use. For mild symptoms you can take over the counter antihistamines such as Benadryl and monitor symptoms closely. If symptoms worsen or if you have severe symptoms including breathing issues, throat closure, significant swelling, whole body hives, severe diarrhea and vomiting, lightheadedness then inject epinephrine and seek immediate medical care afterwards. Action plan given.  Follow up: For allergy injection start: RUSH versus standard build up  Follow up in clinic in 6 months   Thank you so much for letting me partake in your care today.  Don't hesitate to reach out if you have any additional concerns!  , MD  [facility] [place] [place]   Reducing Pollen Exposure  The American Academy of Allergy, Asthma and Immunology suggests the following steps to reduce your exposure to pollen during allergy seasons.    Do not hang sheets or clothing out to dry; pollen may collect on these items. Do not mow lawns or spend time around freshly cut grass; mowing stirs up pollen. Keep windows closed at night.  Keep car windows closed while driving. Minimize morning activities outdoors, a time when pollen counts are usually at their highest. Stay indoors as much as possible when pollen counts or humidity is high and on windy days when pollen tends to remain in the air longer. Use air conditioning when possible.  Many air conditioners have filters that trap the pollen spores. Use a HEPA room air filter to remove pollen form the indoor air you breathe.  DUST MITE AVOIDANCE MEASURES:  There are three  main measures that need and can be taken to avoid house dust mites:  Reduce  accumulation of dust in general -reduce furniture, clothing, carpeting, books, stuffed animals, especially in bedroom  Separate yourself from the dust -use pillow and mattress encasements (can be found at stores such as Bed[employer], and [employer] or online) -avoid direct exposure to air condition flow -use a HEPA filter device, especially in the bedroom; you can also use a HEPA filter vacuum cleaner -wipe dust with a moist towel instead of a dry towel or broom when cleaning  Decrease mites and/or their secretions -wash clothing and linen and stuffed animals at highest temperature possible, at least every 2 weeks -stuffed animals can also be placed in a bag and put in a freezer overnight  Despite the above measures, it is impossible to eliminate dust mites or their allergen completely from your home.  With the above measures the burden of mites in your home can be diminished, with the goal of minimizing your allergic symptoms.  Success will be reached only when implementing and using all means together.  Control of Dog or Cat Allergen  Avoidance is the best way to manage a dog or cat allergy. If you have a dog or cat and are allergic to dog or cats, consider removing the dog or cat from the home. If you have a dog or cat but don't want to find it a new home, or if your family wants a pet even though someone in the household is allergic, here are some strategies that may help keep symptoms at bay:  Keep the pet out of your bedroom and restrict it to only a few rooms. Be advised that keeping the dog or cat in only one room will not limit the allergens to that room. Don't pet, hug or kiss the dog or cat; if you do, wash your hands with soap and water. High-efficiency particulate air (HEPA) cleaners run continuously in a bedroom or living room can reduce allergen levels over time. Regular use of a high-efficiency vacuum cleaner or a central vacuum can reduce allergen levels. Giving your dog or cat a  bath at least once a week can reduce airborne allergen.  Control of Mold Allergen   Mold and fungi can grow on a variety of surfaces provided certain temperature and moisture conditions exist.  Outdoor molds grow on plants, decaying vegetation and soil.  The major outdoor mold, Alternaria and Cladosporium, are found in very high numbers during hot and dry conditions.  Generally, a late Summer - Fall peak is seen for common outdoor fungal spores.  Rain will temporarily lower outdoor mold spore count, but counts rise rapidly when the rainy period ends.  The most important indoor molds are Aspergillus and Penicillium.  Dark, humid and poorly ventilated basements are ideal sites for mold growth.  The next most common sites of mold growth are the bathroom and the kitchen.  Outdoor (Seasonal) Mold Control  Positive outdoor molds via skin testing: Alternaria  Use air conditioning and keep windows closed Avoid exposure to decaying vegetation. Avoid leaf raking. Avoid grain handling. Consider wearing a face mask if working in moldy areas.

## 2022-03-28 DIAGNOSIS — J3081 Allergic rhinitis due to animal (cat) (dog) hair and dander: Secondary | ICD-10-CM

## 2022-03-28 NOTE — Progress Notes (Signed)
VIALS EXP 03-29-23 

## 2022-03-28 NOTE — Progress Notes (Signed)
Aeroallergen Immunotherapy   Ordering Provider: Dr. Ferol Luz   Patient Details  Name: Rio Taber  MRN: [id number]  Date of Birth: 1986/02/16   Order 2 of 2   Vial Label: M-DM-C-R   0.2 ml (Volume)  1:20 Concentration -- Alternaria alternata  0.2 ml (Volume)  1:20 Concentration -- Cladosporium herbarum  0.2 ml (Volume)  1:10 Concentration -- Aspergillus mix  0.2 ml (Volume)  1:10 Concentration -- Penicillium mix  0.2 ml (Volume)  1:20 Concentration -- Bipolaris sorokiniana  0.2 ml (Volume)  1:40 Concentration -- Aureobasidium pullulans  0.2 ml (Volume)  1:10 Concentration -- Rhizopus oryzae  0.2 ml (Volume)  1:40 Concentration -- Botrytis cinerea  0.2 ml (Volume)  1:40 Concentration -- Epicoccum nigrum  0.5 ml (Volume)  1:10 Concentration -- Cat Hair  0.3 ml (Volume)  1:20 Concentration -- Cockroach, German  0.5 ml (Volume)   AU Concentration -- Mite Mix (DF 5,000 & DP 5,000)    3.1  ml Extract Subtotal  1.9  ml Diluent  5.0  ml Maintenance Total   Schedule:   Silver Vial (1:1,000,000): R[facility]  Blue Vial (1:100,000): RUSH  Yellow Vial (1:10,000): RUSH  Green Vial (1:1,000): Schedule B (6 doses)  Red Vial (1:100): Schedule A (10 doses)   Special Instructions: RUSH

## 2022-03-28 NOTE — Progress Notes (Signed)
Aeroallergen Immunotherapy   Ordering Provider: Dr. Ferol Luz   Patient Details  Name: Rita Stokes  MRN: [id number]  Date of Birth: 1985-07-22   Order 1 of 2   Vial Label: G-W-T-D   0.3 ml (Volume)  BAU Concentration -- 7 Grass Mix* 100,000 ([address] [place], [place], [place], [place], [place], Sweet Vernal, Timothy)  0.2 ml (Volume)  1:20 Concentration -- [place]  0.3 ml (Volume)  BAU Concentration -- [place] 10,000  0.2 ml (Volume)  1:20 Concentration -- Johnson  0.3 ml (Volume)  1:20 Concentration -- Ragweed Mix  0.2 ml (Volume)  1:20 Concentration -- Cocklebur  0.2 ml (Volume)  1:20 Concentration -- Burweed Marshelder  0.2 ml (Volume)  1:10 Concentration -- Plantain English  0.5 ml (Volume)  1:20 Concentration -- Weed Mix*  0.5 ml (Volume)  1:20 Concentration -- Eastern 10 Tree Mix (also Sweet Gum)  0.2 ml (Volume)  1:10 Concentration -- Cedar, red  0.2 ml (Volume)  1:10 Concentration -- Pecan Pollen  0.2 ml (Volume)  1:10 Concentration -- Pine Mix  0.2 ml (Volume)  1:20 Concentration -- Walnut, Black Pollen  0.5 ml (Volume)  1:10 Concentration -- Dog Epithelia    4.2  ml Extract Subtotal  0.7  ml Diluent  5.0  ml Maintenance Total   Schedule:   Silver Vial (1:1,000,000): RUSH  Blue Vial (1:100,000): RUSH  Yellow Vial (1:10,000): RUSH  Green Vial (1:1,000): Schedule B (6 doses)  Red Vial (1:100): Schedule A (10 doses)   Special Instructions: RUSH protocol

## 2022-03-29 ENCOUNTER — Encounter: Payer: Self-pay | Admitting: Internal Medicine

## 2022-03-29 DIAGNOSIS — J3089 Other allergic rhinitis: Secondary | ICD-10-CM | POA: Diagnosis not present

## 2022-03-29 MED ORDER — OLOPATADINE HCL 0.2 % OP SOLN: 1.0000 [drp] | Freq: Two times a day (BID) | OPHTHALMIC | 5 refills | Status: DC | PRN

## 2022-03-29 NOTE — Telephone Encounter (Signed)
Placed prescription for pataday eye drops.  Can someone let patient know?  Thanks!

## 2022-04-06 ENCOUNTER — Other Ambulatory Visit: Payer: Self-pay

## 2022-04-08 ENCOUNTER — Telehealth: Payer: Self-pay

## 2022-04-08 MED ORDER — FAMOTIDINE 20 MG PO TABS: ORAL_TABLET | ORAL | 0 refills | Status: DC

## 2022-04-08 MED ORDER — PREDNISONE 20 MG PO TABS: ORAL_TABLET | ORAL | 0 refills | Status: DC

## 2022-04-08 NOTE — Telephone Encounter (Signed)
Premeds sent to pharmacy and pt notify.

## 2022-04-12 ENCOUNTER — Encounter: Payer: Self-pay | Admitting: Internal Medicine

## 2022-04-12 ENCOUNTER — Ambulatory Visit (INDEPENDENT_AMBULATORY_CARE_PROVIDER_SITE_OTHER): Payer: BC Managed Care – PPO | Admitting: Internal Medicine

## 2022-04-12 VITALS — BP 120/72 | HR 72 | Temp 97.9°F | Resp 18

## 2022-04-12 DIAGNOSIS — H1045 Other chronic allergic conjunctivitis: Secondary | ICD-10-CM

## 2022-04-12 DIAGNOSIS — J302 Other seasonal allergic rhinitis: Secondary | ICD-10-CM

## 2022-04-12 DIAGNOSIS — J3089 Other allergic rhinitis: Secondary | ICD-10-CM | POA: Diagnosis not present

## 2022-04-12 NOTE — Progress Notes (Signed)
RAPID DESENSITIZATION Note  RE: Rita Stokes MRN: [id number] DOB: [birth year] Date of Office Visit: 04/12/2022  Subjective:  Patient presents today for rapid desensitization.  Interval History: Patient has not been ill, she has taken all premedications as per protocol.  Recent/Current History: Pulmonary disease: no Cardiac disease: no Respiratory infection: no Rash: no Itch: no Swelling: no Cough: no Shortness of breath: no Runny/stuffy nose: no Itchy eyes: no Beta-blocker use: no  Patient/guardian was informed of the procedure with verbalized understanding of the risk of anaphylaxis. Consent has been signed.   Medication List:  Current Outpatient Medications  Medication Sig Dispense Refill   azelastine (ASTELIN) 0.1 % nasal spray Place 2 sprays into both nostrils 2 (two) times daily. Use in each nostril as directed 30 mL 6   cetirizine (ZYRTEC) 10 MG tablet Take 10 mg by mouth daily.     EPINEPHrine 0.3 mg/0.3 mL IJ SOAJ injection Inject 0.3 mg into the muscle as needed for anaphylaxis. 1 each 1   levonorgestrel (MIRENA) 20 MCG/DAY IUD 1 each by Intrauterine route once.     montelukast (SINGULAIR) 10 MG tablet Take 1 tablet (10 mg total) by mouth at bedtime. 90 tablet 1   Olopatadine HCl (PATADAY) 0.2 % SOLN Place 1 drop into both eyes 2 (two) times daily as needed. 2.5 mL 5   famotidine (PEPCID) 20 MG tablet Take 1 tab Monday morning and 1 tab in the evening and 1 tab Tuesday morning before RUSH and 1 tab in the evening after RUSH appt. (Patient not taking: Reported on 04/12/2022) 4 tablet 0   predniSONE (DELTASONE) 20 MG tablet Take 2 tablets Monday morning and 2 tablets Tuesday morning before coming in for [facility] Tuesday. (Patient not taking: Reported on 04/12/2022) 4 tablet 0   Prenatal Vit-Fe Fumarate-FA (PRENATAL MULTIVITAMIN) TABS tablet Take 1 tablet by mouth daily at 12 noon. (Patient not taking: Reported on 03/22/2022)     No current facility-administered medications  for this visit.   Allergies: No Known Allergies I reviewed her past medical history, social history, family history, and environmental history and no significant changes have been reported from her previous visit.  ROS: Negative except as per HPI.  Objective: BP 120/72   Pulse 72   Temp 97.9 F (36.6 C) (Temporal)   Resp 18   SpO2 98%  There is no height or weight on file to calculate BMI.   General Appearance:  Alert, cooperative, no distress, appears stated age  Head:  Normocephalic, without obvious abnormality, atraumatic  Eyes:  Conjunctiva clear, EOM's intact  Nose: Nares normal  Throat: Lips, tongue normal; teeth and gums normal, normal posterior oropharnyx  Neck: Supple, symmetrical  Lungs:   CTAB, Respirations unlabored, no coughing  Heart:  Appears well perfused  Extremities: No edema  Skin: Skin color, texture, turgor normal, no rashes or lesions on visualized portions of skin  Neurologic: No gross deficits     Diagnostics:  PROCEDURES:  Patient received the following doses every hour: Step 1:  0.39ml - 1:1,000,000 dilution (silver vial) Step 2:  0.57ml - 1:1,000,000 dilution (silver vial) Step 3: 0.5ml - 1:100,000 dilution (blue vial)  Step 4: 0.31ml - 1:100,000 dilution (blue vial)  Step 5: 0.6ml - 1:10,000 dilution (gold vial) Step 6: 0.40ml - 1:10,000 dilution (gold vial) Step 7: 0.54ml - 1:10,000 dilution (gold vial) Step 8: 0.51ml - 1:10,000 dilution (gold vial)  Patient was observed for 1 hour after the last dose.   Procedure started at 8:37  Procedure ended at 4:07   ASSESSMENT/PLAN:   Patient has tolerated the rapid desensitization protocol.  Next appointment: Start at 0.59ml of 1:1000 dilution (green vial) and build up per protocol.

## 2022-04-13 ENCOUNTER — Other Ambulatory Visit: Payer: Self-pay

## 2022-04-13 MED ORDER — OLOPATADINE HCL 0.2 % OP SOLN: 1.0000 [drp] | Freq: Two times a day (BID) | OPHTHALMIC | 5 refills | Status: DC | PRN

## 2022-04-21 ENCOUNTER — Ambulatory Visit (INDEPENDENT_AMBULATORY_CARE_PROVIDER_SITE_OTHER): Payer: BC Managed Care – PPO

## 2022-04-21 DIAGNOSIS — J309 Allergic rhinitis, unspecified: Secondary | ICD-10-CM | POA: Diagnosis not present

## 2022-05-05 ENCOUNTER — Ambulatory Visit (INDEPENDENT_AMBULATORY_CARE_PROVIDER_SITE_OTHER): Payer: BC Managed Care – PPO

## 2022-05-05 DIAGNOSIS — J309 Allergic rhinitis, unspecified: Secondary | ICD-10-CM | POA: Diagnosis not present

## 2022-05-11 ENCOUNTER — Ambulatory Visit (INDEPENDENT_AMBULATORY_CARE_PROVIDER_SITE_OTHER): Payer: BC Managed Care – PPO

## 2022-05-11 DIAGNOSIS — J309 Allergic rhinitis, unspecified: Secondary | ICD-10-CM | POA: Diagnosis not present

## 2022-05-18 ENCOUNTER — Ambulatory Visit (INDEPENDENT_AMBULATORY_CARE_PROVIDER_SITE_OTHER): Payer: BC Managed Care – PPO

## 2022-05-18 DIAGNOSIS — J309 Allergic rhinitis, unspecified: Secondary | ICD-10-CM | POA: Diagnosis not present

## 2022-05-23 ENCOUNTER — Other Ambulatory Visit (HOSPITAL_COMMUNITY): Payer: Self-pay

## 2022-05-24 ENCOUNTER — Encounter: Payer: Self-pay | Admitting: Internal Medicine

## 2022-05-24 ENCOUNTER — Ambulatory Visit (INDEPENDENT_AMBULATORY_CARE_PROVIDER_SITE_OTHER): Payer: BC Managed Care – PPO

## 2022-05-24 DIAGNOSIS — J309 Allergic rhinitis, unspecified: Secondary | ICD-10-CM | POA: Diagnosis not present

## 2022-05-24 NOTE — Telephone Encounter (Signed)
I am sorry to hear that.  Large local reactions do not predict more severe reactions although they can be bothersome.  If you have not tried doubling up on your antihistamines the night before the morning of and the night of your allergy injections I recommend that.  Ice packs can help.  If this does not work where there are some other things we can use like epi rinses and split dosing if needed.  Let us know if these continue.

## 2022-05-31 ENCOUNTER — Ambulatory Visit (INDEPENDENT_AMBULATORY_CARE_PROVIDER_SITE_OTHER): Payer: BC Managed Care – PPO

## 2022-05-31 DIAGNOSIS — J309 Allergic rhinitis, unspecified: Secondary | ICD-10-CM

## 2022-06-14 ENCOUNTER — Ambulatory Visit (INDEPENDENT_AMBULATORY_CARE_PROVIDER_SITE_OTHER): Payer: BC Managed Care – PPO

## 2022-06-14 DIAGNOSIS — J309 Allergic rhinitis, unspecified: Secondary | ICD-10-CM

## 2022-06-23 ENCOUNTER — Other Ambulatory Visit: Payer: Self-pay

## 2022-06-23 ENCOUNTER — Encounter: Payer: Self-pay | Admitting: Internal Medicine

## 2022-06-23 ENCOUNTER — Ambulatory Visit (INDEPENDENT_AMBULATORY_CARE_PROVIDER_SITE_OTHER): Payer: BC Managed Care – PPO

## 2022-06-23 DIAGNOSIS — J309 Allergic rhinitis, unspecified: Secondary | ICD-10-CM

## 2022-06-23 MED ORDER — LEVOCETIRIZINE DIHYDROCHLORIDE 2.5 MG/5ML PO SOLN: 5.0000 mg | Freq: Every evening | ORAL | 5 refills | Status: AC

## 2022-06-27 ENCOUNTER — Telehealth: Payer: Self-pay | Admitting: Family Medicine

## 2022-06-27 ENCOUNTER — Other Ambulatory Visit: Payer: Self-pay

## 2022-06-27 MED ORDER — LEVOCETIRIZINE DIHYDROCHLORIDE 5 MG PO TABS: 5.0000 mg | ORAL_TABLET | Freq: Every evening | ORAL | 5 refills | Status: AC

## 2022-06-27 NOTE — Telephone Encounter (Signed)
If they can cover the xyzal 5 mg tabs, lets try that.  Otherwise she can buy generic from [facility] (1 years supply) for $10.00.

## 2022-06-27 NOTE — Telephone Encounter (Signed)
Lm for pt to call us back but tablet are not covered on her insurance only liquid she can go otc for tablet is she wants to pay out of pocket.

## 2022-06-27 NOTE — Telephone Encounter (Signed)
Hailey called from [facility]  and stated  patient called the pharmacy and wanted the medication  levocetirizine (XYZAL) 2.5 MG/5ML solution switch to tablets instead of solution. Call back number [phone].

## 2022-06-29 ENCOUNTER — Ambulatory Visit (INDEPENDENT_AMBULATORY_CARE_PROVIDER_SITE_OTHER): Payer: BC Managed Care – PPO

## 2022-06-29 DIAGNOSIS — J309 Allergic rhinitis, unspecified: Secondary | ICD-10-CM | POA: Diagnosis not present

## 2022-06-29 NOTE — Telephone Encounter (Signed)
Called and spoke to pt and she said that the pharmacy was able to fix something on there end and got insurance to cover the tablets. Pt was advise that if it ever comes back not cover she is okay to take the liquids with adult dose.

## 2022-07-07 ENCOUNTER — Ambulatory Visit (INDEPENDENT_AMBULATORY_CARE_PROVIDER_SITE_OTHER): Payer: BC Managed Care – PPO

## 2022-07-07 DIAGNOSIS — J309 Allergic rhinitis, unspecified: Secondary | ICD-10-CM | POA: Diagnosis not present

## 2022-07-12 ENCOUNTER — Ambulatory Visit (INDEPENDENT_AMBULATORY_CARE_PROVIDER_SITE_OTHER): Payer: BC Managed Care – PPO

## 2022-07-12 DIAGNOSIS — J309 Allergic rhinitis, unspecified: Secondary | ICD-10-CM

## 2022-07-21 ENCOUNTER — Ambulatory Visit (INDEPENDENT_AMBULATORY_CARE_PROVIDER_SITE_OTHER): Payer: BC Managed Care – PPO

## 2022-07-21 DIAGNOSIS — J309 Allergic rhinitis, unspecified: Secondary | ICD-10-CM

## 2022-07-26 ENCOUNTER — Ambulatory Visit (INDEPENDENT_AMBULATORY_CARE_PROVIDER_SITE_OTHER): Payer: BC Managed Care – PPO

## 2022-07-26 DIAGNOSIS — J309 Allergic rhinitis, unspecified: Secondary | ICD-10-CM

## 2022-08-01 ENCOUNTER — Ambulatory Visit (INDEPENDENT_AMBULATORY_CARE_PROVIDER_SITE_OTHER): Payer: BC Managed Care – PPO

## 2022-08-01 DIAGNOSIS — J309 Allergic rhinitis, unspecified: Secondary | ICD-10-CM

## 2022-08-01 NOTE — Progress Notes (Signed)
EXP 08/01/23

## 2022-08-04 DIAGNOSIS — J3089 Other allergic rhinitis: Secondary | ICD-10-CM

## 2022-08-10 ENCOUNTER — Ambulatory Visit (INDEPENDENT_AMBULATORY_CARE_PROVIDER_SITE_OTHER): Payer: BC Managed Care – PPO | Admitting: *Deleted

## 2022-08-10 DIAGNOSIS — J309 Allergic rhinitis, unspecified: Secondary | ICD-10-CM

## 2022-08-16 ENCOUNTER — Ambulatory Visit (INDEPENDENT_AMBULATORY_CARE_PROVIDER_SITE_OTHER): Payer: BC Managed Care – PPO | Admitting: *Deleted

## 2022-08-16 DIAGNOSIS — J309 Allergic rhinitis, unspecified: Secondary | ICD-10-CM | POA: Diagnosis not present

## 2022-08-24 ENCOUNTER — Ambulatory Visit (INDEPENDENT_AMBULATORY_CARE_PROVIDER_SITE_OTHER): Payer: BC Managed Care – PPO

## 2022-08-24 DIAGNOSIS — J309 Allergic rhinitis, unspecified: Secondary | ICD-10-CM | POA: Diagnosis not present

## 2022-08-31 ENCOUNTER — Ambulatory Visit (INDEPENDENT_AMBULATORY_CARE_PROVIDER_SITE_OTHER): Payer: BC Managed Care – PPO

## 2022-08-31 DIAGNOSIS — J309 Allergic rhinitis, unspecified: Secondary | ICD-10-CM

## 2022-09-08 ENCOUNTER — Ambulatory Visit (INDEPENDENT_AMBULATORY_CARE_PROVIDER_SITE_OTHER): Payer: BC Managed Care – PPO

## 2022-09-08 DIAGNOSIS — J309 Allergic rhinitis, unspecified: Secondary | ICD-10-CM

## 2022-09-14 ENCOUNTER — Ambulatory Visit (INDEPENDENT_AMBULATORY_CARE_PROVIDER_SITE_OTHER): Payer: BC Managed Care – PPO

## 2022-09-14 DIAGNOSIS — J309 Allergic rhinitis, unspecified: Secondary | ICD-10-CM | POA: Diagnosis not present

## 2022-09-20 ENCOUNTER — Ambulatory Visit: Payer: BC Managed Care – PPO | Admitting: Internal Medicine

## 2022-09-20 ENCOUNTER — Encounter: Payer: Self-pay | Admitting: Internal Medicine

## 2022-09-20 VITALS — BP 110/66 | HR 60 | Temp 97.9°F | Resp 16

## 2022-09-20 DIAGNOSIS — J309 Allergic rhinitis, unspecified: Secondary | ICD-10-CM

## 2022-09-20 DIAGNOSIS — J302 Other seasonal allergic rhinitis: Secondary | ICD-10-CM

## 2022-09-20 DIAGNOSIS — H1045 Other chronic allergic conjunctivitis: Secondary | ICD-10-CM | POA: Diagnosis not present

## 2022-09-20 NOTE — Patient Instructions (Signed)
Chronic Rhinitis Seasonal and Perennial Allergic:  - Control: Well controlled  - Prevention:  -  Continue allergen avoidance   - Symptom control: - Continue Antihistamine: daily or daily as needed.   -Options include Zyrtec (Cetirizine) 10mg , Claritin (Loratadine) 10mg , Allegra (Fexofenadine) 180mg , or Xyzal (Levocetirinze) 5mg  - Can be purchased over-the-counter if not covered by insurance.  - AIT PLAN:  continue allergy injections per protocol and carry epipen   For large local reactions increase antihistamine (zyxal or zyrtec) to twice a day starting the night before and continuing for 48 hours  Will add epi-rinses to injections   Allergic Conjunctivitis:  - Consider Allergy Eye drops-great options include Pataday (Olopatadine) or Zaditor (ketotifen) for eye symptoms daily as needed-both sold over the counter if not covered by insurance. and Rewetting Drops such as Systane,TheraTears, etc  -Avoid eye drops that say red eye relief as they may contain medications that dry out your eyes.  Follow up: 12 months   Thank you so much for letting me partake in your care today.  Don't hesitate to reach out if you have any additional concerns!  Ferol Luz, MD  [facility] [place] [place]

## 2022-09-20 NOTE — Progress Notes (Signed)
Follow Up Note  RE: Rita Stokes MRN: [id number] DOB: 07-29-1985 Date of Office Visit: 09/20/2022  Referring provider: Josephina Gip, NP Primary care provider: Josephina Gip, NP  Chief Complaint: Allergic Rhinitis  and Follow-up (Pt states she's been doing well. She's been having some swelling with her Molds vial.)  History of Present Illness: I had the pleasure of seeing Rita Stokes for a follow up visit at the [facility] on 09/20/2022. She is a 37 y.o. female, who is being followed for allergic rhinoconjunctivitis on AIT. Her previous allergy office visit was on 04/12/22 with Dr. Marlynn Perking. Today is a regular follow up visit.  History obtained from patient, chart review .  She is having LLR with vial 1 (M-DM-C-R) Rare itchy eyes in spring. Otherwise nasal symptoms are well controlled No sinus infections in spring.  Has not needed any nasal spray.  Taking zyrtec 10 mg daily with good control of nasal and ocular symptoms.  Denies any adverse effects of medications.   Pertinent History/Diagnostics:  - Allergic Rhinitis: recurrent sinus infections, nasal and ocular sx's year round  - SPT environmental panel (03/22/22): grass, weed, tree, mold, dust mite, dog   - Previous testing positive to mold as well   - AIT started via RUSH 04/12/22: Vial 1 (M-DM-C-R).; Vial 2  (G-W-T-D) - Rx: Astelin, flonase, singulair   - previously complted 1.5 years of Ait on outside extracts, stopped in 2019   Assessment and Plan: Rita Stokes is a 37 y.o. female with: Seasonal and perennial allergic rhinitis  Other chronic allergic conjunctivitis of both eyes   Plan: Patient Instructions  Chronic Rhinitis Seasonal and Perennial Allergic:  - Control: Well controlled  - Prevention:  -  Continue allergen avoidance   - Symptom control: - Continue Antihistamine: daily or daily as needed.   -Options include Zyrtec (Cetirizine) 10mg , Claritin (Loratadine) 10mg , Allegra  (Fexofenadine) 180mg , or Xyzal (Levocetirinze) 5mg  - Can be purchased over-the-counter if not covered by insurance.  - AIT PLAN:  continue allergy injections per protocol and carry epipen   For large local reactions increase antihistamine (zyxal or zyrtec) to twice a day starting the night before and continuing for 48 hours  Will add epi-rinses to injections   Allergic Conjunctivitis:  - Consider Allergy Eye drops-great options include Pataday (Olopatadine) or Zaditor (ketotifen) for eye symptoms daily as needed-both sold over the counter if not covered by insurance. and Rewetting Drops such as Systane,TheraTears, etc  -Avoid eye drops that say red eye relief as they may contain medications that dry out your eyes.  Follow up: 12 months   Thank you so much for letting me partake in your care today.  Don't hesitate to reach out if you have any additional concerns!  Ferol Luz, MD  [facility] [place] [place]     No orders of the defined types were placed in this encounter.   Lab Orders  No laboratory test(s) ordered today   Diagnostics: Spirometry:  None done    Medication List:  Current Outpatient Medications  Medication Sig Dispense Refill   cetirizine (ZYRTEC) 10 MG tablet Take 10 mg by mouth daily.     EPINEPHrine 0.3 mg/0.3 mL IJ SOAJ injection Inject 0.3 mg into the muscle as needed for anaphylaxis. 1 each 1   levocetirizine (XYZAL) 2.5 MG/5ML solution Take 10 mLs (5 mg total) by mouth every evening. 320 mL 5   levocetirizine (XYZAL) 5 MG tablet Take 1 tablet (5 mg  total) by mouth every evening. 30 tablet 5   levonorgestrel (MIRENA) 20 MCG/DAY IUD 1 each by Intrauterine route once.     No current facility-administered medications for this visit.   Allergies: No Known Allergies I reviewed her past medical history, social history, family history, and environmental history and no significant changes have been reported from her previous visit.  ROS:  All others negative except as noted per HPI.   Objective: BP 110/66   Pulse 60   Temp 97.9 F (36.6 C) (Temporal)   Resp 16   SpO2 99%  There is no height or weight on file to calculate BMI. General Appearance:  Alert, cooperative, no distress, appears stated age  Head:  Normocephalic, without obvious abnormality, atraumatic  Eyes:  Conjunctiva clear, EOM's intact  Nose: Nares normal, normal mucosa, no visible anterior polyps, and septum midline  Throat: Lips, tongue normal; teeth and gums normal, normal posterior oropharynx  Neck: Supple, symmetrical  Lungs:   clear to auscultation bilaterally, Respirations unlabored, no coughing  Heart:  regular rate and rhythm and no murmur, Appears well perfused  Extremities: No edema  Skin: Skin color, texture, turgor normal, no rashes or lesions on visualized portions of skin  Neurologic: No gross deficits   Previous notes and tests were reviewed. The plan was reviewed with the patient/family, and all questions/concerned were addressed.  It was my pleasure to see Rita Stokes today and participate in her care. Please feel free to contact me with any questions or concerns.  Sincerely,  Ferol Luz, MD  Allergy & Immunology  [facility] Office: [phone]

## 2022-10-03 ENCOUNTER — Ambulatory Visit (INDEPENDENT_AMBULATORY_CARE_PROVIDER_SITE_OTHER): Payer: BC Managed Care – PPO

## 2022-10-03 DIAGNOSIS — J309 Allergic rhinitis, unspecified: Secondary | ICD-10-CM

## 2022-10-10 ENCOUNTER — Ambulatory Visit (INDEPENDENT_AMBULATORY_CARE_PROVIDER_SITE_OTHER): Payer: BC Managed Care – PPO

## 2022-10-10 DIAGNOSIS — J309 Allergic rhinitis, unspecified: Secondary | ICD-10-CM

## 2022-10-18 ENCOUNTER — Ambulatory Visit (INDEPENDENT_AMBULATORY_CARE_PROVIDER_SITE_OTHER): Payer: BC Managed Care – PPO

## 2022-10-18 DIAGNOSIS — J309 Allergic rhinitis, unspecified: Secondary | ICD-10-CM | POA: Diagnosis not present

## 2022-10-24 ENCOUNTER — Ambulatory Visit (INDEPENDENT_AMBULATORY_CARE_PROVIDER_SITE_OTHER): Payer: BC Managed Care – PPO

## 2022-10-24 DIAGNOSIS — J309 Allergic rhinitis, unspecified: Secondary | ICD-10-CM | POA: Diagnosis not present

## 2022-10-31 ENCOUNTER — Ambulatory Visit (INDEPENDENT_AMBULATORY_CARE_PROVIDER_SITE_OTHER): Payer: BC Managed Care – PPO

## 2022-10-31 DIAGNOSIS — J309 Allergic rhinitis, unspecified: Secondary | ICD-10-CM | POA: Diagnosis not present

## 2022-10-31 NOTE — Progress Notes (Signed)
VIALS EXP 10-31-23 

## 2022-11-01 DIAGNOSIS — J3089 Other allergic rhinitis: Secondary | ICD-10-CM | POA: Diagnosis not present

## 2022-11-07 ENCOUNTER — Ambulatory Visit (INDEPENDENT_AMBULATORY_CARE_PROVIDER_SITE_OTHER): Payer: BC Managed Care – PPO

## 2022-11-07 DIAGNOSIS — J309 Allergic rhinitis, unspecified: Secondary | ICD-10-CM | POA: Diagnosis not present

## 2022-11-14 ENCOUNTER — Ambulatory Visit (INDEPENDENT_AMBULATORY_CARE_PROVIDER_SITE_OTHER): Payer: BC Managed Care – PPO

## 2022-11-14 DIAGNOSIS — J309 Allergic rhinitis, unspecified: Secondary | ICD-10-CM | POA: Diagnosis not present

## 2022-11-20 IMAGING — US US MFM OB DETAIL+14 WK
1 series · 13 of 28 positions shown · non-contrast
Comparison: none

[Series 1: us mfm ob detail+14 wk · 13 of 88 slices shown]
[im 4/88]
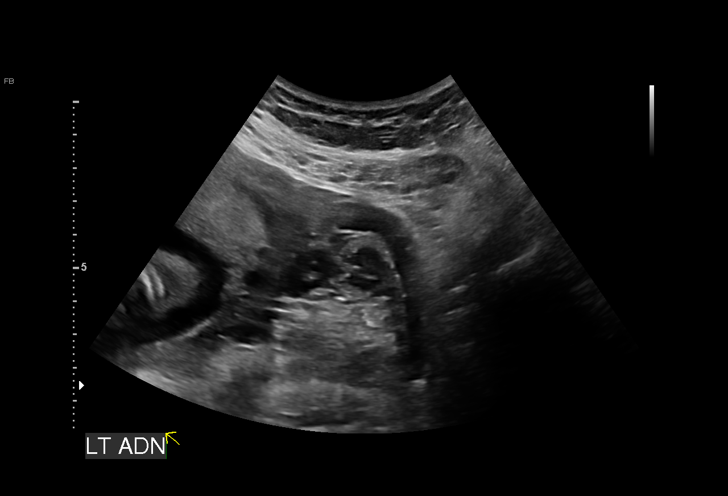
[im 10/88]
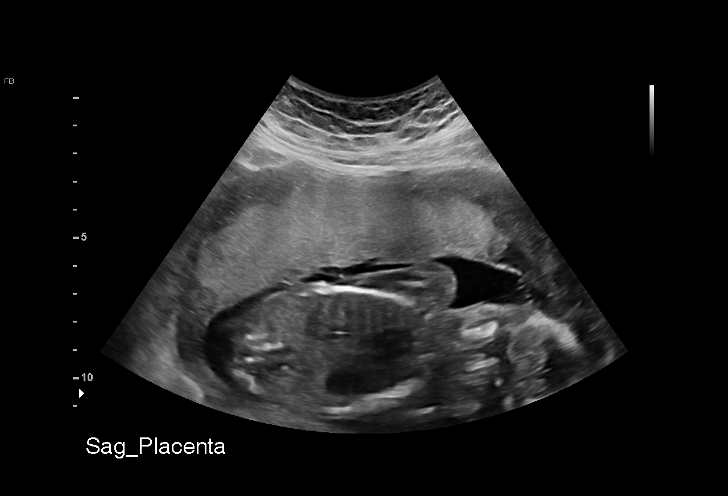
[im 17/88]
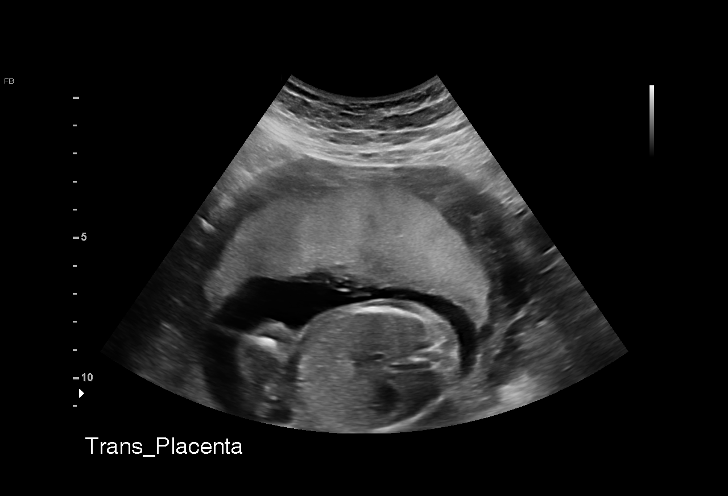
[im 23/88]
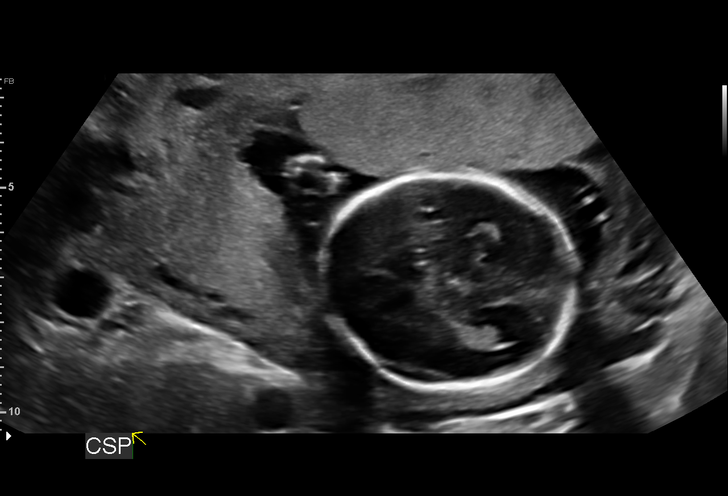
[im 30/88]
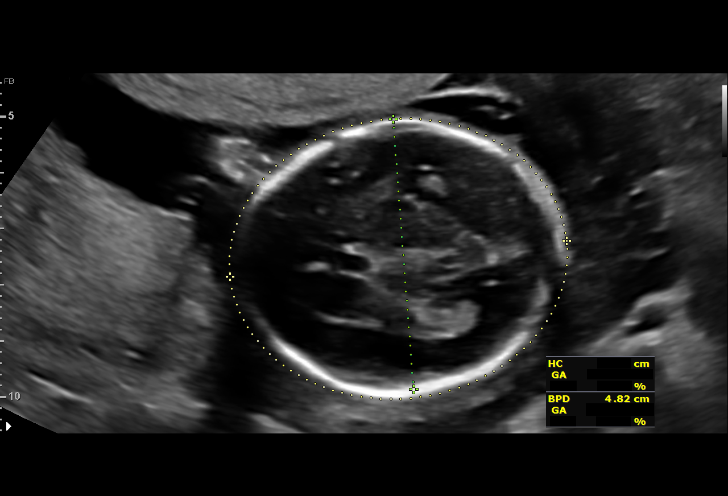
[im 36/88]
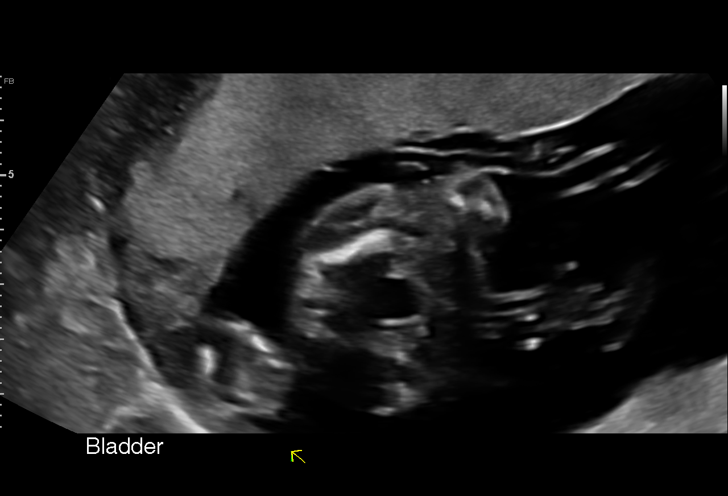
[im 46/88]
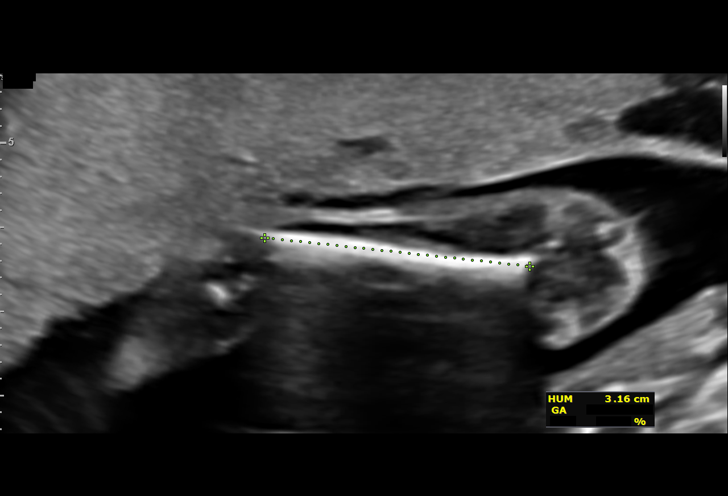
[im 52/88]
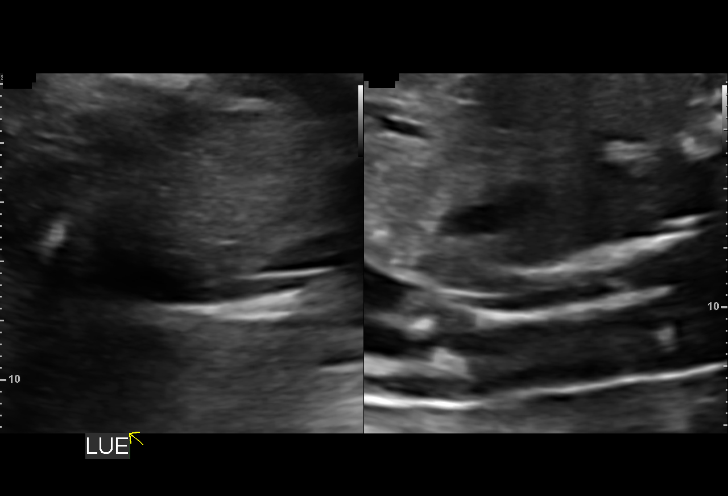
[im 59/88]
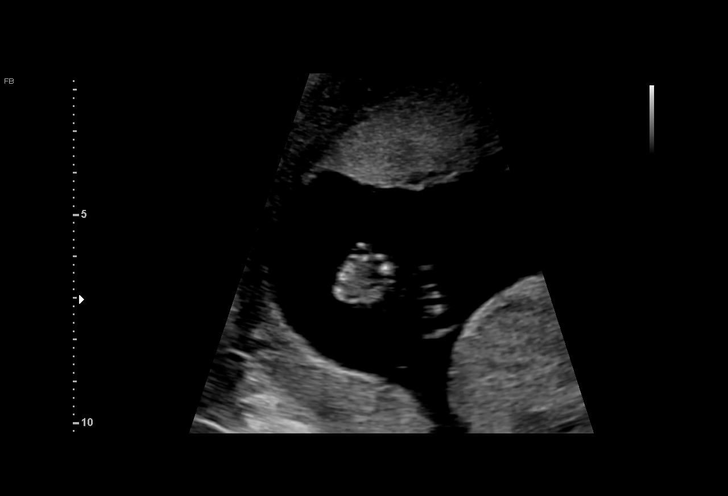
[im 65/88]
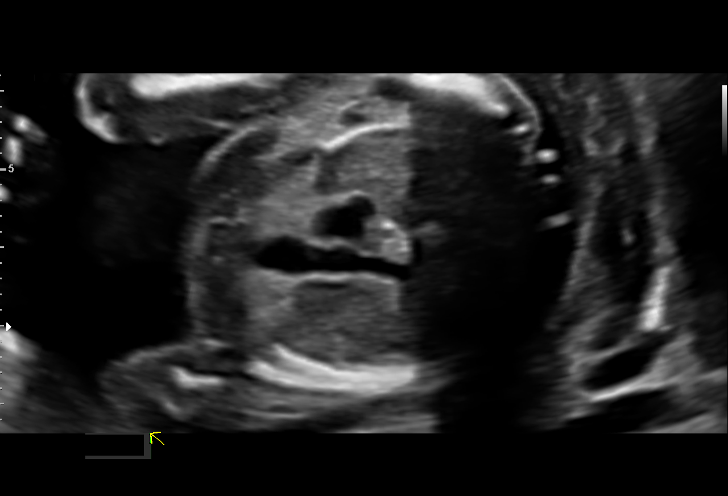
[im 71/88]
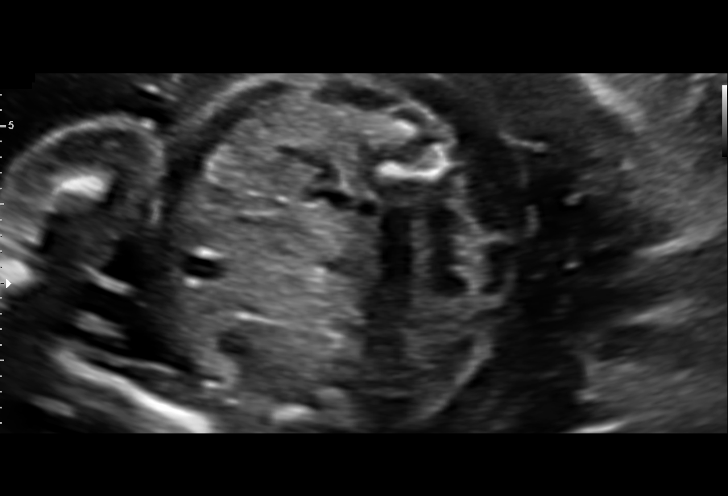
[im 78/88]
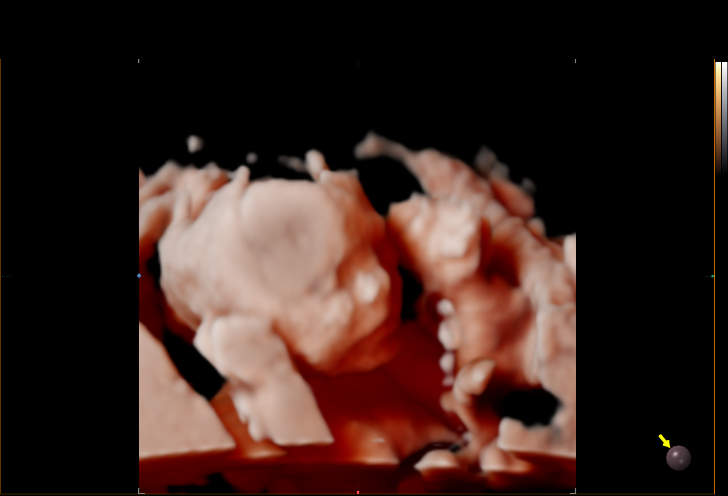
[im 84/88]
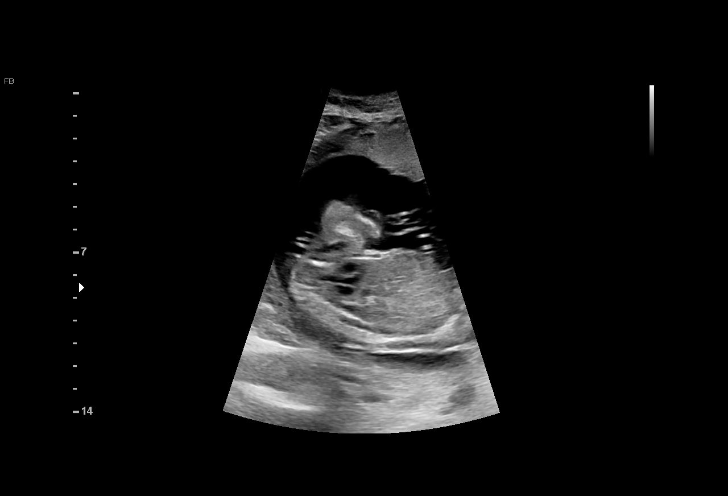

[13 of 28 positions shown; findings below may reference images not displayed]

Indications

 Encounter for antenatal screening for
 malformations
 Advanced maternal age multigravida 35+,
 second trimester (35)
 Obesity complicating pregnancy, second
 trimester (BMI 37.26)
 Poor obstetrical history (pp hypertension)
 Poor obstetrical history (prior LGA)
 19 weeks gestation of pregnancy
Fetal Evaluation

 Num Of Fetuses:         1
 Fetal Heart Rate(bpm):  166
 Cardiac Activity:       Observed
 Presentation:           Variable
 Placenta:               Anterior
 P. Cord Insertion:      Visualized, central

 Amniotic Fluid
 AFI FV:      Within normal limits

                             Largest Pocket(cm)
                             5
Biometry

 BPD:      48.6  mm     G. Age:  20w 5d         94  %    CI:        79.98   %    70 - 86
                                                         FL/HC:      18.5   %    16.1 -
 HC:      171.7  mm     G. Age:  19w 5d         65  %    HC/AC:      1.07        1.09 -
 AC:      160.3  mm     G. Age:  21w 1d         93  %    FL/BPD:     65.4   %
 FL:       31.8  mm     G. Age:  19w 6d         66  %    FL/AC:      19.8   %    20 - 24
 HUM:      31.1  mm     G. Age:  20w 2d         78  %
 CER:      19.8  mm     G. Age:  19w 1d         44  %
 NFT:       4.8  mm

 LV:        7.2  mm
 CM:        4.1  mm

 Est. FW:     357  gm    0 lb 13 oz      97  %
Gestational Age

 LMP:           19w 2d        Date:  04/20/21                 EDD:   01/25/22
 U/S Today:     20w 3d                                        EDD:   01/17/22
 Best:          19w 2d     Det. By:  LMP  (04/20/21)          EDD:   01/25/22
Anatomy

 Cranium:               Appears normal         LVOT:                   Appears normal
 Cavum:                 Appears normal         Aortic Arch:            Not well visualized
 Ventricles:            Appears normal         Ductal Arch:            Not well visualized
 Choroid Plexus:        Appears normal         Diaphragm:              Appears normal
 Cerebellum:            Appears normal         Stomach:                Appears normal, left
                                                                       sided
 Posterior Fossa:       Appears normal         Abdomen:                Appears normal
 Nuchal Fold:           Appears normal         Abdominal Wall:         Appears nml (cord
                                                                       insert, abd wall)
 Face:                  Appears normal         Cord Vessels:           Appears normal (3
                        (orbits and profile)                           vessel cord)
 Lips:                  Not well visualized    Kidneys:                Appear normal
 Palate:                Not well visualized    Bladder:                Appears normal
 Thoracic:              Appears normal         Spine:                  Appears normal
 Heart:                 Not well visualized    Upper Extremities:      Appears normal
 RVOT:                  Appears normal         Lower Extremities:      Appears normal

 Other:  Fetus appears to be a male. Nasal bone visualized. Lenses
         visualized. 3VV visualized. Technically difficult due to maternal
         habitus and fetal position.
Cervix Uterus Adnexa

 Cervix
 Length:           3.92  cm.
 Normal appearance by transabdominal scan.

 Adnexa
 No abnormality visualized.
Impression

 Single intrauterine pregnancy here for a detailed anatomy
 due AMA, elevated BMI with history of LGA
 Normal anatomy with measurements consistent with dates
 There is good fetal movement and amniotic fluid volume
 Suboptimal views of the fetal anatomy were obtained
 secondary to fetal position.
Recommendations

 Follow up growth in 5 weeks.

## 2022-11-22 ENCOUNTER — Ambulatory Visit (INDEPENDENT_AMBULATORY_CARE_PROVIDER_SITE_OTHER): Payer: BC Managed Care – PPO

## 2022-11-22 DIAGNOSIS — J309 Allergic rhinitis, unspecified: Secondary | ICD-10-CM | POA: Diagnosis not present

## 2022-11-28 ENCOUNTER — Ambulatory Visit (INDEPENDENT_AMBULATORY_CARE_PROVIDER_SITE_OTHER): Payer: BC Managed Care – PPO

## 2022-11-28 DIAGNOSIS — J309 Allergic rhinitis, unspecified: Secondary | ICD-10-CM

## 2022-12-05 ENCOUNTER — Ambulatory Visit (INDEPENDENT_AMBULATORY_CARE_PROVIDER_SITE_OTHER): Payer: BC Managed Care – PPO

## 2022-12-05 DIAGNOSIS — J309 Allergic rhinitis, unspecified: Secondary | ICD-10-CM | POA: Diagnosis not present

## 2022-12-13 ENCOUNTER — Ambulatory Visit (INDEPENDENT_AMBULATORY_CARE_PROVIDER_SITE_OTHER): Payer: BC Managed Care – PPO

## 2022-12-13 DIAGNOSIS — J309 Allergic rhinitis, unspecified: Secondary | ICD-10-CM

## 2022-12-20 ENCOUNTER — Ambulatory Visit (INDEPENDENT_AMBULATORY_CARE_PROVIDER_SITE_OTHER): Payer: BC Managed Care – PPO

## 2022-12-20 DIAGNOSIS — J309 Allergic rhinitis, unspecified: Secondary | ICD-10-CM

## 2023-01-04 ENCOUNTER — Ambulatory Visit: Payer: Self-pay

## 2023-01-04 DIAGNOSIS — J309 Allergic rhinitis, unspecified: Secondary | ICD-10-CM | POA: Diagnosis not present

## 2023-01-11 NOTE — Progress Notes (Signed)
VIALS EXP 01-11-24

## 2023-01-16 ENCOUNTER — Ambulatory Visit (INDEPENDENT_AMBULATORY_CARE_PROVIDER_SITE_OTHER): Payer: BC Managed Care – PPO

## 2023-01-16 DIAGNOSIS — J309 Allergic rhinitis, unspecified: Secondary | ICD-10-CM

## 2023-01-20 DIAGNOSIS — J3089 Other allergic rhinitis: Secondary | ICD-10-CM | POA: Diagnosis not present

## 2023-01-31 ENCOUNTER — Ambulatory Visit (INDEPENDENT_AMBULATORY_CARE_PROVIDER_SITE_OTHER): Payer: BC Managed Care – PPO

## 2023-01-31 DIAGNOSIS — J309 Allergic rhinitis, unspecified: Secondary | ICD-10-CM | POA: Diagnosis not present

## 2023-02-16 ENCOUNTER — Ambulatory Visit (INDEPENDENT_AMBULATORY_CARE_PROVIDER_SITE_OTHER): Payer: BC Managed Care – PPO

## 2023-02-16 DIAGNOSIS — J309 Allergic rhinitis, unspecified: Secondary | ICD-10-CM | POA: Diagnosis not present

## 2023-02-22 ENCOUNTER — Ambulatory Visit (INDEPENDENT_AMBULATORY_CARE_PROVIDER_SITE_OTHER): Payer: Self-pay

## 2023-02-22 DIAGNOSIS — J309 Allergic rhinitis, unspecified: Secondary | ICD-10-CM

## 2023-02-28 ENCOUNTER — Ambulatory Visit (INDEPENDENT_AMBULATORY_CARE_PROVIDER_SITE_OTHER): Payer: Self-pay | Admitting: *Deleted

## 2023-02-28 DIAGNOSIS — J309 Allergic rhinitis, unspecified: Secondary | ICD-10-CM

## 2023-03-09 ENCOUNTER — Ambulatory Visit (INDEPENDENT_AMBULATORY_CARE_PROVIDER_SITE_OTHER): Payer: BC Managed Care – PPO

## 2023-03-09 DIAGNOSIS — J309 Allergic rhinitis, unspecified: Secondary | ICD-10-CM | POA: Diagnosis not present

## 2023-03-20 ENCOUNTER — Ambulatory Visit (INDEPENDENT_AMBULATORY_CARE_PROVIDER_SITE_OTHER): Payer: BC Managed Care – PPO

## 2023-03-20 DIAGNOSIS — J309 Allergic rhinitis, unspecified: Secondary | ICD-10-CM

## 2023-03-21 ENCOUNTER — Other Ambulatory Visit: Payer: Self-pay

## 2023-04-04 ENCOUNTER — Ambulatory Visit (INDEPENDENT_AMBULATORY_CARE_PROVIDER_SITE_OTHER): Payer: BC Managed Care – PPO

## 2023-04-04 DIAGNOSIS — J309 Allergic rhinitis, unspecified: Secondary | ICD-10-CM | POA: Diagnosis not present

## 2023-04-18 ENCOUNTER — Ambulatory Visit (INDEPENDENT_AMBULATORY_CARE_PROVIDER_SITE_OTHER): Payer: Self-pay

## 2023-04-18 DIAGNOSIS — J309 Allergic rhinitis, unspecified: Secondary | ICD-10-CM

## 2023-05-02 NOTE — Progress Notes (Signed)
VIALS EXP 05-01-24

## 2023-05-04 ENCOUNTER — Ambulatory Visit (INDEPENDENT_AMBULATORY_CARE_PROVIDER_SITE_OTHER): Payer: 59

## 2023-05-04 DIAGNOSIS — J309 Allergic rhinitis, unspecified: Secondary | ICD-10-CM | POA: Diagnosis not present

## 2023-05-05 DIAGNOSIS — J3089 Other allergic rhinitis: Secondary | ICD-10-CM | POA: Diagnosis not present

## 2023-06-01 ENCOUNTER — Ambulatory Visit (INDEPENDENT_AMBULATORY_CARE_PROVIDER_SITE_OTHER): Payer: 59

## 2023-06-01 DIAGNOSIS — J309 Allergic rhinitis, unspecified: Secondary | ICD-10-CM | POA: Diagnosis not present

## 2023-07-03 ENCOUNTER — Ambulatory Visit: Payer: Self-pay

## 2023-07-11 ENCOUNTER — Ambulatory Visit (INDEPENDENT_AMBULATORY_CARE_PROVIDER_SITE_OTHER): Payer: Self-pay

## 2023-07-11 DIAGNOSIS — J309 Allergic rhinitis, unspecified: Secondary | ICD-10-CM

## 2023-07-18 ENCOUNTER — Ambulatory Visit (INDEPENDENT_AMBULATORY_CARE_PROVIDER_SITE_OTHER): Payer: Self-pay

## 2023-07-18 DIAGNOSIS — J309 Allergic rhinitis, unspecified: Secondary | ICD-10-CM

## 2023-08-24 ENCOUNTER — Ambulatory Visit (INDEPENDENT_AMBULATORY_CARE_PROVIDER_SITE_OTHER): Payer: Self-pay

## 2023-08-24 DIAGNOSIS — J309 Allergic rhinitis, unspecified: Secondary | ICD-10-CM | POA: Diagnosis not present

## 2023-08-29 ENCOUNTER — Ambulatory Visit (INDEPENDENT_AMBULATORY_CARE_PROVIDER_SITE_OTHER): Payer: Self-pay

## 2023-08-29 DIAGNOSIS — J309 Allergic rhinitis, unspecified: Secondary | ICD-10-CM

## 2023-09-29 ENCOUNTER — Ambulatory Visit (INDEPENDENT_AMBULATORY_CARE_PROVIDER_SITE_OTHER): Payer: Self-pay | Admitting: *Deleted

## 2023-09-29 DIAGNOSIS — J309 Allergic rhinitis, unspecified: Secondary | ICD-10-CM

## 2023-10-31 ENCOUNTER — Ambulatory Visit (INDEPENDENT_AMBULATORY_CARE_PROVIDER_SITE_OTHER): Payer: Self-pay

## 2023-10-31 DIAGNOSIS — J309 Allergic rhinitis, unspecified: Secondary | ICD-10-CM

## 2023-11-20 DIAGNOSIS — J3081 Allergic rhinitis due to animal (cat) (dog) hair and dander: Secondary | ICD-10-CM | POA: Diagnosis not present

## 2023-11-20 NOTE — Progress Notes (Signed)
 VIALS MADE 11-20-23

## 2023-11-21 DIAGNOSIS — J3089 Other allergic rhinitis: Secondary | ICD-10-CM | POA: Diagnosis not present

## 2023-11-27 ENCOUNTER — Ambulatory Visit (INDEPENDENT_AMBULATORY_CARE_PROVIDER_SITE_OTHER)

## 2023-11-27 DIAGNOSIS — J309 Allergic rhinitis, unspecified: Secondary | ICD-10-CM

## 2023-12-10 ENCOUNTER — Other Ambulatory Visit: Payer: Self-pay | Admitting: Medical Genetics

## 2024-01-23 ENCOUNTER — Ambulatory Visit (INDEPENDENT_AMBULATORY_CARE_PROVIDER_SITE_OTHER)

## 2024-01-23 DIAGNOSIS — J309 Allergic rhinitis, unspecified: Secondary | ICD-10-CM

## 2024-01-31 ENCOUNTER — Ambulatory Visit (INDEPENDENT_AMBULATORY_CARE_PROVIDER_SITE_OTHER)

## 2024-01-31 DIAGNOSIS — J309 Allergic rhinitis, unspecified: Secondary | ICD-10-CM

## 2024-02-06 ENCOUNTER — Other Ambulatory Visit: Payer: Self-pay | Admitting: Medical Genetics

## 2024-02-06 DIAGNOSIS — Z006 Encounter for examination for normal comparison and control in clinical research program: Secondary | ICD-10-CM

## 2024-02-27 ENCOUNTER — Other Ambulatory Visit (HOSPITAL_COMMUNITY): Payer: Self-pay

## 2024-03-06 LAB — GENECONNECT MOLECULAR SCREEN: Genetic Analysis Overall Interpretation: NEGATIVE
